# Patient Record
Sex: Male | Born: 1945 | Race: White | Hispanic: No | Smoking: Current every day smoker
Health system: Southern US, Community
[De-identification: ages and names within clinical notes are randomized; demographics above are authoritative.]

## PROBLEM LIST (undated history)

## (undated) DIAGNOSIS — I639 Cerebral infarction, unspecified: Secondary | ICD-10-CM

## (undated) DIAGNOSIS — F0150 Vascular dementia without behavioral disturbance: Secondary | ICD-10-CM

## (undated) DIAGNOSIS — I10 Essential (primary) hypertension: Secondary | ICD-10-CM

## (undated) DIAGNOSIS — M069 Rheumatoid arthritis, unspecified: Secondary | ICD-10-CM

## (undated) DIAGNOSIS — F319 Bipolar disorder, unspecified: Secondary | ICD-10-CM

## (undated) HISTORY — PX: NO PAST SURGERIES: SHX2092

---

## 2016-07-06 ENCOUNTER — Inpatient Hospital Stay (HOSPITAL_COMMUNITY)
Admission: EM | Admit: 2016-07-06 | Discharge: 2016-07-12 | DRG: 371 | Disposition: A | Discharge status: Skilled Nursing Facility | Payer: Medicare HMO | Attending: Internal Medicine | Admitting: Internal Medicine

## 2016-07-06 ENCOUNTER — Emergency Department (HOSPITAL_COMMUNITY): Payer: Medicare HMO

## 2016-07-06 ENCOUNTER — Encounter (HOSPITAL_COMMUNITY): Payer: Self-pay | Admitting: Internal Medicine

## 2016-07-06 DIAGNOSIS — W19XXXA Unspecified fall, initial encounter: Secondary | ICD-10-CM

## 2016-07-06 DIAGNOSIS — B359 Dermatophytosis, unspecified: Secondary | ICD-10-CM

## 2016-07-06 DIAGNOSIS — S0191XA Laceration without foreign body of unspecified part of head, initial encounter: Secondary | ICD-10-CM

## 2016-07-06 DIAGNOSIS — S01111A Laceration without foreign body of right eyelid and periocular area, initial encounter: Secondary | ICD-10-CM | POA: Diagnosis present

## 2016-07-06 DIAGNOSIS — A047 Enterocolitis due to Clostridium difficile: Secondary | ICD-10-CM | POA: Diagnosis present

## 2016-07-06 DIAGNOSIS — Z8673 Personal history of transient ischemic attack (TIA), and cerebral infarction without residual deficits: Secondary | ICD-10-CM

## 2016-07-06 DIAGNOSIS — E876 Hypokalemia: Secondary | ICD-10-CM | POA: Diagnosis present

## 2016-07-06 DIAGNOSIS — Z87891 Personal history of nicotine dependence: Secondary | ICD-10-CM

## 2016-07-06 DIAGNOSIS — W010XXA Fall on same level from slipping, tripping and stumbling without subsequent striking against object, initial encounter: Secondary | ICD-10-CM | POA: Diagnosis present

## 2016-07-06 DIAGNOSIS — N179 Acute kidney failure, unspecified: Secondary | ICD-10-CM

## 2016-07-06 DIAGNOSIS — S51811A Laceration without foreign body of right forearm, initial encounter: Secondary | ICD-10-CM | POA: Diagnosis present

## 2016-07-06 DIAGNOSIS — Z7952 Long term (current) use of systemic steroids: Secondary | ICD-10-CM | POA: Diagnosis not present

## 2016-07-06 DIAGNOSIS — Z681 Body mass index (BMI) 19 or less, adult: Secondary | ICD-10-CM | POA: Diagnosis not present

## 2016-07-06 DIAGNOSIS — Z23 Encounter for immunization: Secondary | ICD-10-CM | POA: Diagnosis not present

## 2016-07-06 DIAGNOSIS — E43 Unspecified severe protein-calorie malnutrition: Secondary | ICD-10-CM | POA: Diagnosis present

## 2016-07-06 DIAGNOSIS — S0181XA Laceration without foreign body of other part of head, initial encounter: Secondary | ICD-10-CM | POA: Diagnosis not present

## 2016-07-06 DIAGNOSIS — E860 Dehydration: Secondary | ICD-10-CM | POA: Diagnosis present

## 2016-07-06 DIAGNOSIS — D696 Thrombocytopenia, unspecified: Secondary | ICD-10-CM | POA: Diagnosis present

## 2016-07-06 DIAGNOSIS — I10 Essential (primary) hypertension: Secondary | ICD-10-CM | POA: Diagnosis present

## 2016-07-06 DIAGNOSIS — F0150 Vascular dementia without behavioral disturbance: Secondary | ICD-10-CM | POA: Diagnosis present

## 2016-07-06 DIAGNOSIS — M069 Rheumatoid arthritis, unspecified: Secondary | ICD-10-CM | POA: Diagnosis present

## 2016-07-06 DIAGNOSIS — F319 Bipolar disorder, unspecified: Secondary | ICD-10-CM | POA: Diagnosis present

## 2016-07-06 DIAGNOSIS — S81801A Unspecified open wound, right lower leg, initial encounter: Secondary | ICD-10-CM | POA: Diagnosis present

## 2016-07-06 DIAGNOSIS — D649 Anemia, unspecified: Secondary | ICD-10-CM

## 2016-07-06 DIAGNOSIS — Z79899 Other long term (current) drug therapy: Secondary | ICD-10-CM | POA: Diagnosis not present

## 2016-07-06 DIAGNOSIS — F0390 Unspecified dementia without behavioral disturbance: Secondary | ICD-10-CM

## 2016-07-06 DIAGNOSIS — R197 Diarrhea, unspecified: Secondary | ICD-10-CM | POA: Diagnosis present

## 2016-07-06 DIAGNOSIS — L89151 Pressure ulcer of sacral region, stage 1: Secondary | ICD-10-CM | POA: Diagnosis present

## 2016-07-06 HISTORY — DX: Cerebral infarction, unspecified: I63.9

## 2016-07-06 HISTORY — DX: Vascular dementia, unspecified severity, without behavioral disturbance, psychotic disturbance, mood disturbance, and anxiety: F01.50

## 2016-07-06 HISTORY — DX: Rheumatoid arthritis, unspecified: M06.9

## 2016-07-06 HISTORY — DX: Bipolar disorder, unspecified: F31.9

## 2016-07-06 HISTORY — DX: Essential (primary) hypertension: I10

## 2016-07-06 LAB — CBC WITH DIFFERENTIAL/PLATELET
BASOS ABS: 0 10*3/uL (ref 0.0–0.1)
Basophils Relative: 0 %
Eosinophils Absolute: 0 10*3/uL (ref 0.0–0.7)
Eosinophils Relative: 0 %
HEMATOCRIT: 35.7 % — AB (ref 39.0–52.0)
HEMOGLOBIN: 12.2 g/dL — AB (ref 13.0–17.0)
LYMPHS PCT: 6 %
Lymphs Abs: 1.1 10*3/uL (ref 0.7–4.0)
MCH: 30.1 pg (ref 26.0–34.0)
MCHC: 34.2 g/dL (ref 30.0–36.0)
MCV: 88.1 fL (ref 78.0–100.0)
MONOS PCT: 3 %
Monocytes Absolute: 0.5 10*3/uL (ref 0.1–1.0)
NEUTROS PCT: 91 %
Neutro Abs: 15.9 10*3/uL — ABNORMAL HIGH (ref 1.7–7.7)
Platelets: 197 10*3/uL (ref 150–400)
RBC: 4.05 MIL/uL — AB (ref 4.22–5.81)
RDW: 19.5 % — AB (ref 11.5–15.5)
WBC: 17.5 10*3/uL — AB (ref 4.0–10.5)

## 2016-07-06 LAB — BASIC METABOLIC PANEL
ANION GAP: 11 (ref 5–15)
BUN: 15 mg/dL (ref 6–20)
CALCIUM: 8.3 mg/dL — AB (ref 8.9–10.3)
CHLORIDE: 100 mmol/L — AB (ref 101–111)
CO2: 24 mmol/L (ref 22–32)
Creatinine, Ser: 1.61 mg/dL — ABNORMAL HIGH (ref 0.61–1.24)
GFR calc non Af Amer: 41 mL/min — ABNORMAL LOW (ref >60)
GFR, EST AFRICAN AMERICAN: 48 mL/min — AB (ref >60)
Glucose, Bld: 103 mg/dL — ABNORMAL HIGH (ref 65–99)
Potassium: 3.1 mmol/L — ABNORMAL LOW (ref 3.5–5.1)
Sodium: 135 mmol/L (ref 135–145)

## 2016-07-06 LAB — URINE MICROSCOPIC-ADD ON

## 2016-07-06 LAB — URINALYSIS, ROUTINE W REFLEX MICROSCOPIC
Glucose, UA: NEGATIVE mg/dL
KETONES UR: 15 mg/dL — AB
NITRITE: NEGATIVE
PROTEIN: 100 mg/dL — AB
SPECIFIC GRAVITY, URINE: 1.021 (ref 1.005–1.030)
pH: 6 (ref 5.0–8.0)

## 2016-07-06 LAB — C DIFFICILE QUICK SCREEN W PCR REFLEX
C Diff antigen: POSITIVE — AB
C Diff interpretation: DETECTED
C Diff toxin: POSITIVE — AB

## 2016-07-06 LAB — CK: CK TOTAL: 225 U/L (ref 49–397)

## 2016-07-06 LAB — I-STAT CG4 LACTIC ACID, ED: Lactic Acid, Venous: 1.34 mmol/L (ref 0.5–1.9)

## 2016-07-06 MED ORDER — NICOTINE 14 MG/24HR TD PT24
14.0000 mg | MEDICATED_PATCH | Freq: Every day | TRANSDERMAL | Status: DC
  Administered 2016-07-07 – 2016-07-12 (×6): 14 mg via TRANSDERMAL
  Filled 2016-07-06 (×6): qty 1

## 2016-07-06 MED ORDER — PERPHENAZINE 4 MG PO TABS
4.0000 mg | ORAL_TABLET | Freq: Every day | ORAL | Status: DC
  Administered 2016-07-07 – 2016-07-08 (×2): 4 mg via ORAL
  Filled 2016-07-06 (×2): qty 1

## 2016-07-06 MED ORDER — LIDOCAINE-EPINEPHRINE 2 %-1:100000 IJ SOLN: 20.0000 mL | Freq: Once | INTRAMUSCULAR | Status: DC

## 2016-07-06 MED ORDER — ACETAMINOPHEN 650 MG RE SUPP: 650.0000 mg | Freq: Four times a day (QID) | RECTAL | Status: DC | PRN

## 2016-07-06 MED ORDER — SODIUM CHLORIDE 0.9 % IV BOLUS (SEPSIS)
500.0000 mL | Freq: Once | INTRAVENOUS | Status: AC
  Administered 2016-07-06: 500 mL via INTRAVENOUS

## 2016-07-06 MED ORDER — NICOTINE 14 MG/24HR TD PT24
14.0000 mg | MEDICATED_PATCH | Freq: Once | TRANSDERMAL | Status: AC
  Administered 2016-07-06: 14 mg via TRANSDERMAL
  Filled 2016-07-06 (×2): qty 1

## 2016-07-06 MED ORDER — FINASTERIDE 5 MG PO TABS
5.0000 mg | ORAL_TABLET | Freq: Every day | ORAL | Status: DC
Start: 2016-07-07 — End: 2016-07-12
  Administered 2016-07-07 – 2016-07-12 (×6): 5 mg via ORAL
  Filled 2016-07-06 (×6): qty 1

## 2016-07-06 MED ORDER — NYSTATIN 100000 UNIT/GM EX POWD
Freq: Two times a day (BID) | CUTANEOUS | Status: DC
  Administered 2016-07-07: 09:00:00 via TOPICAL
  Administered 2016-07-07: 1 via TOPICAL
  Administered 2016-07-07 – 2016-07-12 (×9): via TOPICAL
  Filled 2016-07-06: qty 15

## 2016-07-06 MED ORDER — ONDANSETRON HCL 4 MG/2ML IJ SOLN: 4.0000 mg | Freq: Four times a day (QID) | INTRAMUSCULAR | Status: DC | PRN

## 2016-07-06 MED ORDER — PREDNISONE 5 MG PO TABS
5.0000 mg | ORAL_TABLET | Freq: Every day | ORAL | Status: DC
  Administered 2016-07-07 – 2016-07-12 (×6): 5 mg via ORAL
  Filled 2016-07-06 (×6): qty 1

## 2016-07-06 MED ORDER — HALOPERIDOL LACTATE 5 MG/ML IJ SOLN
2.0000 mg | Freq: Once | INTRAMUSCULAR | Status: AC
  Administered 2016-07-06: 2 mg via INTRAVENOUS
  Filled 2016-07-06: qty 1

## 2016-07-06 MED ORDER — DONEPEZIL HCL 10 MG PO TABS
10.0000 mg | ORAL_TABLET | Freq: Every day | ORAL | Status: DC
  Administered 2016-07-06 – 2016-07-11 (×6): 10 mg via ORAL
  Filled 2016-07-06 (×6): qty 1

## 2016-07-06 MED ORDER — FLUTICASONE FUROATE-VILANTEROL 100-25 MCG/INH IN AEPB
1.0000 | INHALATION_SPRAY | Freq: Every day | RESPIRATORY_TRACT | Status: DC
  Administered 2016-07-07 – 2016-07-12 (×5): 1 via RESPIRATORY_TRACT
  Filled 2016-07-06: qty 28

## 2016-07-06 MED ORDER — METOPROLOL TARTRATE 50 MG PO TABS
50.0000 mg | ORAL_TABLET | Freq: Two times a day (BID) | ORAL | Status: DC
  Administered 2016-07-06 – 2016-07-11 (×11): 50 mg via ORAL
  Filled 2016-07-06 (×12): qty 1

## 2016-07-06 MED ORDER — PERPHENAZINE 8 MG PO TABS
8.0000 mg | ORAL_TABLET | Freq: Every day | ORAL | Status: DC
  Administered 2016-07-07 (×2): 8 mg via ORAL
  Filled 2016-07-06 (×3): qty 1

## 2016-07-06 MED ORDER — SODIUM CHLORIDE 0.9 % IV SOLN
INTRAVENOUS | Status: DC
  Administered 2016-07-06 – 2016-07-07 (×2): 1 mL via INTRAVENOUS

## 2016-07-06 MED ORDER — QUETIAPINE FUMARATE 50 MG PO TABS
50.0000 mg | ORAL_TABLET | Freq: Every day | ORAL | Status: DC
  Administered 2016-07-06 – 2016-07-11 (×6): 50 mg via ORAL
  Filled 2016-07-06 (×6): qty 1

## 2016-07-06 MED ORDER — POTASSIUM CHLORIDE CRYS ER 20 MEQ PO TBCR
40.0000 meq | EXTENDED_RELEASE_TABLET | Freq: Once | ORAL | Status: AC
  Administered 2016-07-06: 40 meq via ORAL
  Filled 2016-07-06: qty 2

## 2016-07-06 MED ORDER — TETANUS-DIPHTH-ACELL PERTUSSIS 5-2.5-18.5 LF-MCG/0.5 IM SUSP
0.5000 mL | Freq: Once | INTRAMUSCULAR | Status: AC
  Administered 2016-07-06: 0.5 mL via INTRAMUSCULAR
  Filled 2016-07-06: qty 0.5

## 2016-07-06 MED ORDER — POTASSIUM CHLORIDE CRYS ER 10 MEQ PO TBCR
10.0000 meq | EXTENDED_RELEASE_TABLET | Freq: Every day | ORAL | Status: DC
  Administered 2016-07-07 – 2016-07-08 (×2): 10 meq via ORAL
  Filled 2016-07-06 (×2): qty 1

## 2016-07-06 MED ORDER — ONDANSETRON HCL 4 MG PO TABS: 4.0000 mg | ORAL_TABLET | Freq: Four times a day (QID) | ORAL | Status: DC | PRN

## 2016-07-06 MED ORDER — FOLIC ACID 1 MG PO TABS
1.0000 mg | ORAL_TABLET | Freq: Every day | ORAL | Status: DC
  Administered 2016-07-07 – 2016-07-12 (×6): 1 mg via ORAL
  Filled 2016-07-06 (×6): qty 1

## 2016-07-06 MED ORDER — LIDOCAINE HCL (PF) 1 % IJ SOLN
5.0000 mL | Freq: Once | INTRAMUSCULAR | Status: AC
  Administered 2016-07-06: 5 mL via INTRADERMAL
  Filled 2016-07-06: qty 5

## 2016-07-06 MED ORDER — ACETAMINOPHEN 325 MG PO TABS
650.0000 mg | ORAL_TABLET | Freq: Four times a day (QID) | ORAL | Status: DC | PRN
  Administered 2016-07-07: 650 mg via ORAL
  Filled 2016-07-06: qty 2

## 2016-07-06 NOTE — ED Notes (Signed)
Pt's dinner tray arrived. 

## 2016-07-06 NOTE — ED Triage Notes (Addendum)
EMS brought patient from apartment, where he was found by nurses aid. Lac to R eyebrow and R forearm; c/o pain on  "bottom". EMS gave 500 cc bolus on route. Reports no total loss of consciousness; pt has confusion at baseline.

## 2016-07-06 NOTE — ED Notes (Signed)
Delay in lab draw, pt in exray 

## 2016-07-06 NOTE — ED Notes (Signed)
NP at bedside.

## 2016-07-06 NOTE — ED Notes (Signed)
Suture Cart at Bedside 

## 2016-07-06 NOTE — ED Provider Notes (Signed)
[facility]-EMERGENCY DEPT Provider Note   CSN: [id number] Arrival date & time: 07/06/16  1132     History   Chief Complaint Chief Complaint  Patient presents with  . Fall    HPI Paul Hansen is a 70 y.o. male.  The history is provided by the patient and medical records.  Fall    70 year old male with unknown past medical history presenting to the ED after a fall. Patient is a poor historian. He was reportedly found by a nurse's aide this morning on the floor in his apartment. He states he fell sometime last night but thought he got back in the bed. Questionable LOC.  States he lives alone but someone comes to bring him food during the day and check on him. He has a laceration to the right eyebrow as well as skin tears of the right forearm and right knee.  Patient complains of buttock pain as well. On arrival, patient is covered and loose stool. Skin in his genital region and buttock area appears very irritated.  States he has some "sores" on his butt that have been there for quite some time.  No past medical history on file.  There are no active problems to display for this patient.   No past surgical history on file.     Home Medications    Prior to Admission medications   Not on File    Family History No family history on file.  Social History Social History  Substance Use Topics  . Smoking status: Not on file  . Smokeless tobacco: Not on file  . Alcohol use Not on file     Allergies   Review of patient's allergies indicates not on file.   Review of Systems Review of Systems  Unable to perform ROS: Dementia     Physical Exam Updated Vital Signs BP 108/63   Pulse 76   Resp 22   SpO2 99%   Physical Exam  Constitutional: He is oriented to person, place, and time. He appears well-developed and well-nourished.  HENT:  Head: Normocephalic and atraumatic.    Mouth/Throat: Oropharynx is clear and moist.  2cm irregular laceration above right eyebrow, no  active bleeding, dried blood surrounding wound; no skull depression noted  Eyes: Conjunctivae and EOM are normal. Pupils are equal, round, and reactive to light.  Neck: Normal range of motion.  Cardiovascular: Normal rate, regular rhythm and normal heart sounds.   Pulmonary/Chest: Effort normal and breath sounds normal.  Abdominal: Soft. Bowel sounds are normal.  Genitourinary:  Genitourinary Comments: Skin in the groin creases and buttock area appears red and irritated, there are some skin sores noted along his buttock, no bleeding, no purulent drainage, no abscess formation  Musculoskeletal: Normal range of motion.  Skin tears of right proximal dorsal forearm as well as right knee Right knee is mildly swollen without gross deformity. Full flexion and extension maintained, DP pulses intact bilaterally Pelvis stable and non-tender; no leg shortening  Neurological: He is alert and oriented to person, place, and time.  AAO to self and place; spontaneously moving extremities, following commands when directed, answering most questions appropriately; no apparent ataxia, speech overall clear and goal oriented  Skin: Skin is warm and dry.  Psychiatric: He has a normal mood and affect.  Nursing note and vitals reviewed.    ED Treatments / Results  Labs (all labs ordered are listed, but only abnormal results are displayed) Labs Reviewed  CBC WITH DIFFERENTIAL/PLATELET - Abnormal; Notable for  the following:       Result Value   WBC 17.5 (*)    RBC 4.05 (*)    Hemoglobin 12.2 (*)    HCT 35.7 (*)    RDW 19.5 (*)    Neutro Abs 15.9 (*)    All other components within normal limits  BASIC METABOLIC PANEL - Abnormal; Notable for the following:    Potassium 3.1 (*)    Chloride 100 (*)    Glucose, Bld 103 (*)    Creatinine, Ser 1.61 (*)    Calcium 8.3 (*)    GFR calc non Af Amer 41 (*)    GFR calc Af Amer 48 (*)    All other components within normal limits  URINE CULTURE  CK  URINALYSIS,  ROUTINE W REFLEX MICROSCOPIC (NOT AT [facility])  I-STAT CG4 LACTIC ACID, ED    EKG  EKG Interpretation None       Radiology Dg Forearm Right  Result Date: 07/06/2016 CLINICAL DATA:  Larey Seat today.  Right forearm pain. EXAM: RIGHT FOREARM - 2 VIEW COMPARISON:  None. FINDINGS: The wrist and elbow joints are maintained. No acute forearm fracture. IMPRESSION: No acute fracture. Electronically Signed   By: Rudie Meyer M.D.   On: 07/06/2016 13:19   Ct Head Wo Contrast  Result Date: 07/06/2016 CLINICAL DATA:  Fall last night. Laceration above the right eye. Initial encounter. EXAM: CT HEAD WITHOUT CONTRAST CT CERVICAL SPINE WITHOUT CONTRAST TECHNIQUE: Multidetector CT imaging of the head and cervical spine was performed following the standard protocol without intravenous contrast. Multiplanar CT image reconstructions of the cervical spine were also generated. COMPARISON:  Brain MRI 12/03/2009. FINDINGS: CT HEAD FINDINGS Brain: There is moderate cerebral atrophy with greatest involvement of the parietal lobes. 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 There is no evidence of acute cortical infarct, intracranial hemorrhage, mass, midline shift, or extra-axial fluid collection. Patchy to confluent cerebral white matter hypodensities are nonspecific but compatible with moderate chronic small vessel ischemic disease, advanced for age. Vascular: Calcified atherosclerosis at the skullbase. Skull: No fracture identified. Sinuses/Orbits: Visualized paranasal sinuses and mastoid air cells are clear. Orbits are unremarkable. Other: Mild supraorbital soft tissue swelling. CT CERVICAL SPINE FINDINGS Alignment: No evidence of traumatic subluxation. Skull base and vertebrae: No fracture or destructive osseous lesion. Soft tissues and spinal canal: No prevertebral edema. Mild right-sided cervical atherosclerosis. Disc levels:  Mild cervical spondylosis, most notable at C6-7. Upper chest: Advanced  centrilobular emphysema. Right greater than left apical lung scarring. Other: None. IMPRESSION: 1. No evidence of acute intracranial abnormality. 2. Mild right supraorbital soft tissue swelling. 3. Moderately advanced chronic small vessel ischemic disease. 4. No acute abnormality identified in the cervical spine. Electronically Signed   By: Sebastian Ache M.D.   On: 07/06/2016 13:51   Ct Cervical Spine Wo Contrast  Result Date: 07/06/2016 CLINICAL DATA:  Fall last night. Laceration above the right eye. Initial encounter. EXAM: CT HEAD WITHOUT CONTRAST CT CERVICAL SPINE WITHOUT CONTRAST TECHNIQUE: Multidetector CT imaging of the head and cervical spine was performed following the standard protocol without intravenous contrast. Multiplanar CT image reconstructions of the cervical spine were also generated. COMPARISON:  Brain MRI 12/03/2009. FINDINGS: CT HEAD FINDINGS Brain: There is moderate cerebral atrophy with greatest involvement of the parietal lobes. 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 There is no evidence of acute cortical infarct, intracranial hemorrhage, mass, midline shift, or extra-axial fluid collection. Patchy to confluent cerebral  white matter hypodensities are nonspecific but compatible with moderate chronic small vessel ischemic disease, advanced for age. Vascular: Calcified atherosclerosis at the skullbase. Skull: No fracture identified. Sinuses/Orbits: Visualized paranasal sinuses and mastoid air cells are clear. Orbits are unremarkable. Other: Mild supraorbital soft tissue swelling. CT CERVICAL SPINE FINDINGS Alignment: No evidence of traumatic subluxation. Skull base and vertebrae: No fracture or destructive osseous lesion. Soft tissues and spinal canal: No prevertebral edema. Mild right-sided cervical atherosclerosis. Disc levels:  Mild cervical spondylosis, most notable at C6-7. Upper chest: Advanced centrilobular emphysema. Right greater than left apical lung  scarring. Other: None. IMPRESSION: 1. No evidence of acute intracranial abnormality. 2. Mild right supraorbital soft tissue swelling. 3. Moderately advanced chronic small vessel ischemic disease. 4. No acute abnormality identified in the cervical spine. Electronically Signed   By: Sebastian Ache M.D.   On: 07/06/2016 13:51   Dg Knee Complete 4 Views Right  Result Date: 07/06/2016 CLINICAL DATA:  Tripped over own feet and fell, weakness, anterior RIGHT laceration EXAM: RIGHT KNEE - COMPLETE 4+ VIEW COMPARISON:  Non FINDINGS: Osseous demineralization. Joint space narrowing. No acute fracture, dislocation, or bone destruction. No knee joint effusion. Scattered vascular calcifications. IMPRESSION: Osseous demineralization with mild degenerative changes. No acute bony abnormalities identified. Electronically Signed   By: Ulyses Southward M.D.   On: 07/06/2016 13:21    Procedures Procedures (including critical care time)  LACERATION REPAIR Performed by: Garlon Hatchet Authorized by: Garlon Hatchet Consent: Verbal consent obtained. Risks and benefits: risks, benefits and alternatives were discussed Consent given by: patient Patient identity confirmed: provided demographic data Prepped and Draped in normal sterile fashion Wound explored  Laceration Location: right eyebrow  Laceration Length: 2cm  No Foreign Bodies seen or palpated  Anesthesia: local infiltration  Local anesthetic: lidocaine 1% without epinephrine  Anesthetic total: 3 ml  Irrigation method: syringe Amount of cleaning: standard  Skin closure: 4-0 prolene  Number of sutures: 2  Technique: simple interrupted  Patient tolerance: Patient tolerated the procedure well with no immediate complications.   Medications Ordered in ED Medications  nicotine (NICODERM CQ - dosed in mg/24 hours) patch 14 mg (14 mg Transdermal Patch Applied 07/06/16 1503)  potassium chloride SA (K-DUR,KLOR-CON) CR tablet 40 mEq (not administered)    sodium chloride 0.9 % bolus 500 mL (not administered)  sodium chloride 0.9 % bolus 500 mL (0 mLs Intravenous Stopped 07/06/16 1510)  Tdap (BOOSTRIX) injection 0.5 mL (0.5 mLs Intramuscular Given 07/06/16 1414)  lidocaine (PF) (XYLOCAINE) 1 % injection 5 mL (5 mLs Intradermal Given 07/06/16 1411)     Initial Impression / Assessment and Plan / ED Course  I have reviewed the triage vital signs and the nursing notes.  Pertinent labs & imaging results that were available during my care of the patient were reviewed by me and considered in my medical decision making (see chart for details).  Clinical Course   70 year old male here after an apparent fall. Patient is a very poor historian and has baseline confusion. He is awake and alert, oriented to self and place. States he thinks he fell sometime last night but is not entirely sure. Unsure of loss of consciousness.  Patient is unaware of his baseline medical issues.  We'll obtain screening labs as well as CT head and cervical spine. He has some skin tears noted his right forearm and right knee, will also obtain plain films of these areas.  Tetanus updated.  Skin of buttock and genital region appear irritated without  abscess formation or drainage. Brief changed, cream applied.    Patient screening labs with leukocytosis.  No left shift.  Lactate normal.  No systemic signs of infection currently.  Mild hypokalemia at 3.1-- given replacement here. His serum creatinine is 1.61, no old values available for comparison.  Patient was given IVF here.  Imaging is negative for acute injuries.  Tetanus updated.  Laceration repaired as above, patient tolerated well. Patient remains at his baseline.  Eating Mal[place]key sandwich and drinking juice.  UA pending.  2:39 PM Spoke with patient's social worker Ms. Monroe-- patient has confusion at baseline and has been his at his norm recently.  Facility staff member, Lola, states patient came downstairs this morning and for  breakfast and told her that he had fallen in his room.  He had blood on his head.  Apparently patient's nurses aid who was with him to assist with ADL's quit last week so this is his first week he has been alone in his facility without direct supervision/assistance.  Patient's social worker is currently working with Eligha BridegroomShannon Gray on trying to get him into a SNF.  States there is availability, they are trying to complete paperwork.  Appreciates medical evaluation today.  States ok to transport back to his facility, they will arrange temporary care for him until they can get him placed.  4:05 PM Care signed out to oncoming provider, PA [place].  Will follow results of UA, anticipate discharge.  He will need PTAR to transport back to his facility.  Case discussed with attending physician, Dr. Estell HarpinZammit, who agrees with assessment and plan of care.  Final Clinical Impressions(s) / ED Diagnoses   Final diagnoses:  Fall, initial encounter  Laceration of head, initial encounter  Hypokalemia    New Prescriptions New Prescriptions   No medications on file     Garlon HatchetLisa M Sheriece Jefcoat, PA-C 07/06/16 [address][id number]    Zanaria Morell M [place] Spainhower, PA-C 07/06/16 1607    Bethann BerkshireJoseph Zammit, MD 07/08/16 [phone]

## 2016-07-06 NOTE — ED Notes (Signed)
Gave pt coke per Chrislyn-RN.

## 2016-07-06 NOTE — H&P (Signed)
History and Physical    Paul Hansen URK:[id number] DOB: 12-21-1945 DOA: 07/06/2016  PCP: Dan Maker, MD   Patient coming from: Home ([facility] in [place], [phone])  Chief Complaint: Referred to ED after having a fall in his apartment  HPI: Paul Hansen is a 70 y.o. gentleman with a history of RA on methotrexate and prednisone, vascular dementia, bipolar disorder, prior CVA, and HTN who was referred to the ED after having a fall in his apartment (unwitnessed) that resulted in a laceration over his right eye.  He denies loss of consciousness.  He denies preceding aura, headache, palpitations, chest pain, or shortness of breath.  He denies any blood in his urine or stool.  He has had three episodes of diarrhea in the ED today, but he denies a history of diarrhea prior today.  No abdominal pain.  No nausea or vomiting.  The patient is a limited historian due to his history of dementia.  He has a legal guardian Annell Greening who was able to provide some additional clinical history (records faxed), which has been reviewed.  ED Course: Imaging including right forearm xray, right knee xray, and CT of the head and cervical spine do not show acute fractures.  His right eyebrow laceration has been sutured.  He has a leukocytosis of 17.  He has had three episodes of diarrhea in the ED.  Stool C diff PCR and GI panel ordered.  Enteric precautions.  No empiric antibiotics for now.  Lactic acid level 1.34; he does not appear septic.  Hospitalist asked to admit.  He will need SNF placement.  Review of Systems: As per HPI otherwise 10 point review of systems negative.    Past Medical History:  Diagnosis Date  . Bipolar disorder (HCC)   . Hypertension   . Rheumatoid arthritis (HCC)   . Stroke (HCC)   . Vascular dementia     Past Surgical History:  Procedure Laterality Date  . NO PAST SURGERIES    Except "minor orthopedic procedures" per the patient.   reports that he has  been smoking.  He has never used smokeless tobacco. He reports that he does not drink alcohol or use drugs.  Active tobacco use "all his life"; currently smokes 1ppd.  He is divorced.  He has an adult son and daughter who visit but he has a legal guardian who makes medical decisions.  No Known Allergies  Family History  Problem Relation Age of Onset  . Family history unknown: Yes  He denies any known history of conditions that run in his family.   Prior to Admission medications   Not on File  List was received by fax and reviewed by me.  Pharmacy to enter home medications into the system for reconciliation at discharge.  Physical Exam: Vitals:   07/06/16 1715 07/06/16 1730 07/06/16 1815 07/06/16 1845  BP: 135/95 105/74 107/94 120/95  Pulse: 98 91 100 110  Resp:      SpO2: 99% 100% 98% 97%      Constitutional: NAD, calm, attempting to resist admission to the hospital but cooperative once redirected.  Unkempt appearance.  Thin elderly gentleman, frail appearing. Vitals:   07/06/16 1715 07/06/16 1730 07/06/16 1815 07/06/16 1845  BP: 135/95 105/74 107/94 120/95  Pulse: 98 91 100 110  Resp:      SpO2: 99% 100% 98% 97%   Eyes: PERRL, lids and conjunctivae normal.  Laceration above right eye. ENMT: Mucous membranes are moist.  Posterior pharynx clear of any exudate or lesions. Normal dentition.  Neck: normal appearance, supple, no masses Respiratory: clear to auscultation bilaterally, no wheezing, no crackles. Normal respiratory effort. No accessory muscle use.  Cardiovascular: Normal rate, regular rhythm, no murmurs / rubs / gallops. No extremity edema. 2+ pedal pulses. GI: abdomen is soft and compressible.  No distention.  No tenderness.  No masses palpated.  Bowel sounds are present. Musculoskeletal:  No joint deformity in upper and lower extremities. Good ROM, no contractures. Normal muscle tone.  Skin: multiple bruises present, probable tinea in inguinal folds, at least stage I  breakdown in gluteal fold, POA Neurologic: CN 2-12 grossly intact. Sensation intact, Strength symmetric bilaterally, 5/5  Psychiatric: Becomes agitated when talking about hospital admission.  He is only oriented to person.  His judgement and insight are impaired.    Labs on Admission: I have personally reviewed following labs and imaging studies  CBC:  Recent Labs Lab 07/06/16 1356  WBC 17.5*  NEUTROABS 15.9*  HGB 12.2*  HCT 35.7*  MCV 88.1  PLT 197   Basic Metabolic Panel:  Recent Labs Lab 07/06/16 1356  NA 135  K 3.1*  CL 100*  CO2 24  GLUCOSE 103*  BUN 15  CREATININE 1.61*  CALCIUM 8.3*   Cardiac Enzymes:  Recent Labs Lab 07/06/16 1356  CKTOTAL 225   Urine analysis:    Component Value Date/Time   COLORURINE AMBER (A) 07/06/2016 1627   APPEARANCEUR CLEAR 07/06/2016 1627   LABSPEC 1.021 07/06/2016 1627   PHURINE 6.0 07/06/2016 1627   GLUCOSEU NEGATIVE 07/06/2016 1627   HGBUR TRACE (A) 07/06/2016 1627   BILIRUBINUR MODERATE (A) 07/06/2016 1627   KETONESUR 15 (A) 07/06/2016 1627   PROTEINUR 100 (A) 07/06/2016 1627   NITRITE NEGATIVE 07/06/2016 1627   LEUKOCYTESUR TRACE (A) 07/06/2016 1627   Sepsis Labs:  Lactic acid level 1.34  Radiological Exams on Admission: Dg Forearm Right  Result Date: 07/06/2016 CLINICAL DATA:  Larey Seat today.  Right forearm pain. EXAM: RIGHT FOREARM - 2 VIEW COMPARISON:  None. FINDINGS: The wrist and elbow joints are maintained. No acute forearm fracture. IMPRESSION: No acute fracture. Electronically Signed   By: Rudie Meyer M.D.   On: 07/06/2016 13:19   Ct Head Wo Contrast  Result Date: 07/06/2016 CLINICAL DATA:  Fall last night. Laceration above the right eye. Initial encounter. EXAM: CT HEAD WITHOUT CONTRAST CT CERVICAL SPINE WITHOUT CONTRAST TECHNIQUE: Multidetector CT imaging of the head and cervical spine was performed following the standard protocol without intravenous contrast. Multiplanar CT image reconstructions of the  cervical spine were also generated. COMPARISON:  Brain MRI 12/03/2009. FINDINGS: CT HEAD FINDINGS Brain: There is moderate cerebral atrophy with greatest involvement of the parietal lobes. 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 There is no evidence of acute cortical infarct, intracranial hemorrhage, mass, midline shift, or extra-axial fluid collection. Patchy to confluent cerebral white matter hypodensities are nonspecific but compatible with moderate chronic small vessel ischemic disease, advanced for age. Vascular: Calcified atherosclerosis at the skullbase. Skull: No fracture identified. Sinuses/Orbits: Visualized paranasal sinuses and mastoid air cells are clear. Orbits are unremarkable. Other: Mild supraorbital soft tissue swelling. CT CERVICAL SPINE FINDINGS Alignment: No evidence of traumatic subluxation. Skull base and vertebrae: No fracture or destructive osseous lesion. Soft tissues and spinal canal: No prevertebral edema. Mild right-sided cervical atherosclerosis. Disc levels:  Mild cervical spondylosis, most notable at C6-7. Upper chest: Advanced centrilobular emphysema. Right greater than left apical lung  scarring. Other: None. IMPRESSION: 1. No evidence of acute intracranial abnormality. 2. Mild right supraorbital soft tissue swelling. 3. Moderately advanced chronic small vessel ischemic disease. 4. No acute abnormality identified in the cervical spine. Electronically Signed   By: Sebastian Ache M.D.   On: 07/06/2016 13:51   Ct Cervical Spine Wo Contrast  Result Date: 07/06/2016 CLINICAL DATA:  Fall last night. Laceration above the right eye. Initial encounter. EXAM: CT HEAD WITHOUT CONTRAST CT CERVICAL SPINE WITHOUT CONTRAST TECHNIQUE: Multidetector CT imaging of the head and cervical spine was performed following the standard protocol without intravenous contrast. Multiplanar CT image reconstructions of the cervical spine were also generated. COMPARISON:  Brain MRI  12/03/2009. FINDINGS: CT HEAD FINDINGS Brain: There is moderate cerebral atrophy with greatest involvement of the parietal lobes. 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 There is no evidence of acute cortical infarct, intracranial hemorrhage, mass, midline shift, or extra-axial fluid collection. Patchy to confluent cerebral white matter hypodensities are nonspecific but compatible with moderate chronic small vessel ischemic disease, advanced for age. Vascular: Calcified atherosclerosis at the skullbase. Skull: No fracture identified. Sinuses/Orbits: Visualized paranasal sinuses and mastoid air cells are clear. Orbits are unremarkable. Other: Mild supraorbital soft tissue swelling. CT CERVICAL SPINE FINDINGS Alignment: No evidence of traumatic subluxation. Skull base and vertebrae: No fracture or destructive osseous lesion. Soft tissues and spinal canal: No prevertebral edema. Mild right-sided cervical atherosclerosis. Disc levels:  Mild cervical spondylosis, most notable at C6-7. Upper chest: Advanced centrilobular emphysema. Right greater than left apical lung scarring. Other: None. IMPRESSION: 1. No evidence of acute intracranial abnormality. 2. Mild right supraorbital soft tissue swelling. 3. Moderately advanced chronic small vessel ischemic disease. 4. No acute abnormality identified in the cervical spine. Electronically Signed   By: Sebastian Ache M.D.   On: 07/06/2016 13:51   Dg Knee Complete 4 Views Right  Result Date: 07/06/2016 CLINICAL DATA:  Tripped over own feet and fell, weakness, anterior RIGHT laceration EXAM: RIGHT KNEE - COMPLETE 4+ VIEW COMPARISON:  Non FINDINGS: Osseous demineralization. Joint space narrowing. No acute fracture, dislocation, or bone destruction. No knee joint effusion. Scattered vascular calcifications. IMPRESSION: Osseous demineralization with mild degenerative changes. No acute bony abnormalities identified. Electronically Signed   By: Ulyses Southward  M.D.   On: 07/06/2016 13:21    EKG: Independently reviewed. Sinus rhythm with PVCs.  No acute ST segment elevations.  Assessment/Plan Active Problems:   Diarrhea   Fall   Dementia   HTN (hypertension)   Bipolar disorder (HCC)   History of CVA (cerebrovascular accident)   RA (rheumatoid arthritis) (HCC)   AKI (acute kidney injury) (HCC)   Dehydration   Anemia   Hypokalemia   Decubitus ulcer of sacral region, stage 1   Tinea      Fall with face laceration --Laceration repaired in the ED --Fall precautions --No longer safe to live independently --Will need SNF referral  AKI (presumed) with U/A that suggest dehydration --NS at 125 cc/hr, repeat labs in the AM --Strict I/O  RA --Hold methotrexate for now --Continue daily prednisone dose  Hypokalemia --Replacement ordered  Diarrhea --Enteric precautions --Stool C diff PCR, GI pathogen panel pending  History of dementia, bipolar --Continue home doses of Aricept, perphenazine, seroquel  Tinea, skin breakdown --Nystatin powder BID --Wound care consult    DVT prophylaxis: SCDs Code Status: FULL Family Communication: I spoke with patient's legal guardian [place] [phone]. Disposition Plan: SNF Consults called: NONE Admission status: Inpatient, med  surg   TIME SPENT: 75 minutes   Jerene Bearsarter,Kiyana Vazguez Harrison MD [facility] Pager [phone]  If 7PM-7AM, please contact night-coverage [email or web address] Password [email or web address]  07/06/2016, 7:07 PM

## 2016-07-06 NOTE — Care Management (Signed)
ED CM spoke with patient at bedside. He reports living alone at a ALF in [place].  Noted in record patient is being evaluated for SNF placement at [facility]. Chrislyn RN, states  patient is having diarrhea, weak, unsteady gait, with elevated WBC. ED evaluation still in progress by  Langston Masker NP EDP

## 2016-07-06 NOTE — ED Notes (Signed)
[employer] (Child psychotherapist):  Office: [phone] Cell: [phone]

## 2016-07-06 NOTE — ED Notes (Signed)
Pt was bladder scanned with the highest result of 91mL. Informed Chrislyn - RN.

## 2016-07-06 NOTE — ED Notes (Signed)
Report called to Tess, RN at this time.  Receiving nurse denies having any further questions at this time.

## 2016-07-07 ENCOUNTER — Inpatient Hospital Stay (HOSPITAL_COMMUNITY): Payer: Medicare HMO

## 2016-07-07 DIAGNOSIS — W19XXXD Unspecified fall, subsequent encounter: Secondary | ICD-10-CM

## 2016-07-07 DIAGNOSIS — I10 Essential (primary) hypertension: Secondary | ICD-10-CM

## 2016-07-07 DIAGNOSIS — E876 Hypokalemia: Secondary | ICD-10-CM

## 2016-07-07 LAB — CBC
HEMATOCRIT: 30.4 % — AB (ref 39.0–52.0)
Hemoglobin: 10 g/dL — ABNORMAL LOW (ref 13.0–17.0)
MCH: 29.3 pg (ref 26.0–34.0)
MCHC: 32.9 g/dL (ref 30.0–36.0)
MCV: 89.1 fL (ref 78.0–100.0)
PLATELETS: 143 10*3/uL — AB (ref 150–400)
RBC: 3.41 MIL/uL — ABNORMAL LOW (ref 4.22–5.81)
RDW: 19.3 % — AB (ref 11.5–15.5)
WBC: 10.7 10*3/uL — ABNORMAL HIGH (ref 4.0–10.5)

## 2016-07-07 LAB — COMPREHENSIVE METABOLIC PANEL
ALT: 10 U/L — ABNORMAL LOW (ref 17–63)
AST: 23 U/L (ref 15–41)
Albumin: 2 g/dL — ABNORMAL LOW (ref 3.5–5.0)
Alkaline Phosphatase: 74 U/L (ref 38–126)
Anion gap: 9 (ref 5–15)
BILIRUBIN TOTAL: 0.8 mg/dL (ref 0.3–1.2)
BUN: 14 mg/dL (ref 6–20)
CHLORIDE: 105 mmol/L (ref 101–111)
CO2: 20 mmol/L — ABNORMAL LOW (ref 22–32)
Calcium: 7.4 mg/dL — ABNORMAL LOW (ref 8.9–10.3)
Creatinine, Ser: 1.56 mg/dL — ABNORMAL HIGH (ref 0.61–1.24)
GFR, EST AFRICAN AMERICAN: 50 mL/min — AB (ref >60)
GFR, EST NON AFRICAN AMERICAN: 43 mL/min — AB (ref >60)
Glucose, Bld: 99 mg/dL (ref 65–99)
POTASSIUM: 2.9 mmol/L — AB (ref 3.5–5.1)
Sodium: 134 mmol/L — ABNORMAL LOW (ref 135–145)
TOTAL PROTEIN: 5.1 g/dL — AB (ref 6.5–8.1)

## 2016-07-07 LAB — GASTROINTESTINAL PANEL BY PCR, STOOL (REPLACES STOOL CULTURE)
ADENOVIRUS F40/41: NOT DETECTED
Astrovirus: NOT DETECTED
CAMPYLOBACTER SPECIES: NOT DETECTED
Cryptosporidium: NOT DETECTED
Cyclospora cayetanensis: NOT DETECTED
ENTEROAGGREGATIVE E COLI (EAEC): NOT DETECTED
ENTEROPATHOGENIC E COLI (EPEC): NOT DETECTED
Entamoeba histolytica: NOT DETECTED
Enterotoxigenic E coli (ETEC): NOT DETECTED
GIARDIA LAMBLIA: NOT DETECTED
NOROVIRUS GI/GII: NOT DETECTED
Plesimonas shigelloides: NOT DETECTED
ROTAVIRUS A: NOT DETECTED
SHIGA LIKE TOXIN PRODUCING E COLI (STEC): NOT DETECTED
SHIGELLA/ENTEROINVASIVE E COLI (EIEC): NOT DETECTED
Salmonella species: NOT DETECTED
Sapovirus (I, II, IV, and V): NOT DETECTED
VIBRIO CHOLERAE: NOT DETECTED
Vibrio species: NOT DETECTED
Yersinia enterocolitica: NOT DETECTED

## 2016-07-07 LAB — URINE CULTURE

## 2016-07-07 LAB — BASIC METABOLIC PANEL
Anion gap: 6 (ref 5–15)
BUN: 14 mg/dL (ref 6–20)
CO2: 23 mmol/L (ref 22–32)
CREATININE: 1.39 mg/dL — AB (ref 0.61–1.24)
Calcium: 7.7 mg/dL — ABNORMAL LOW (ref 8.9–10.3)
Chloride: 110 mmol/L (ref 101–111)
GFR calc Af Amer: 57 mL/min — ABNORMAL LOW (ref >60)
GFR, EST NON AFRICAN AMERICAN: 49 mL/min — AB (ref >60)
GLUCOSE: 92 mg/dL (ref 65–99)
POTASSIUM: 3.8 mmol/L (ref 3.5–5.1)
SODIUM: 139 mmol/L (ref 135–145)

## 2016-07-07 LAB — MAGNESIUM: MAGNESIUM: 1.7 mg/dL (ref 1.7–2.4)

## 2016-07-07 LAB — MRSA PCR SCREENING: MRSA by PCR: NEGATIVE

## 2016-07-07 MED ORDER — POTASSIUM CHLORIDE CRYS ER 20 MEQ PO TBCR
30.0000 meq | EXTENDED_RELEASE_TABLET | ORAL | Status: AC
  Administered 2016-07-07 (×2): 30 meq via ORAL
  Filled 2016-07-07 (×2): qty 1

## 2016-07-07 MED ORDER — SODIUM CHLORIDE 0.9 % IV SOLN
INTRAVENOUS | Status: DC
  Administered 2016-07-07: 20:00:00 via INTRAVENOUS
  Filled 2016-07-07 (×2): qty 1000

## 2016-07-07 MED ORDER — BOOST / RESOURCE BREEZE PO LIQD
1.0000 | Freq: Three times a day (TID) | ORAL | Status: DC
Start: 2016-07-07 — End: 2016-07-12
  Administered 2016-07-07 – 2016-07-12 (×12): 1 via ORAL

## 2016-07-07 MED ORDER — VANCOMYCIN 50 MG/ML ORAL SOLUTION
125.0000 mg | Freq: Four times a day (QID) | ORAL | Status: DC
  Administered 2016-07-07 – 2016-07-12 (×24): 125 mg via ORAL
  Filled 2016-07-07 (×25): qty 2.5

## 2016-07-07 MED ORDER — SACCHAROMYCES BOULARDII 250 MG PO CAPS
250.0000 mg | ORAL_CAPSULE | Freq: Two times a day (BID) | ORAL | Status: DC
  Administered 2016-07-07 – 2016-07-12 (×12): 250 mg via ORAL
  Filled 2016-07-07 (×12): qty 1

## 2016-07-07 NOTE — Consult Note (Addendum)
WOC Nurse wound consult note Reason for Consult: Consult requested for buttocks.  Pt is having frequent diarrhea stools related to possible C-diff Wound type: Inner buttocks near rectum are red, macerated, with partial thickness skin loss.  Appearance is consistent with moisture associated skin damage. Pressure Ulcer POA: This is NOT a pressure injury. Measurement: Affected area is approx 6X6cm Wound bed: Red and moist Dressing procedure/placement/frequency: Barrier cream to repel moisture and protect skin.   Please re-consult if further assistance is needed.  Thank-you,  Cammie Mcgee MSN, RN, CWOCN, [place], CNS [phone]

## 2016-07-07 NOTE — Progress Notes (Signed)
PROGRESS NOTE  Paul Hansen  MBW:[id number] DOB: 06-12-1946  DOA: 07/06/2016 PCP: Dan Maker, MD   Brief Narrative:  70 y.o. gentleman with a history of RA on methotrexate and prednisone, vascular dementia, bipolar disorder, prior CVA, and HTN who was referred to the ED after having a fall in his apartment (unwitnessed) that resulted in a laceration over his right eye.  He denies loss of consciousness. In the ED noted to have 3 episodes of diarrhea. Initially denied history of diarrhea at home but then stated that he's been having diarrhea for 2 days. Limited historian due to her history of dementia. Imaging including right forearm xray, right knee xray, and CT of the head and cervical spine do not show acute fractures.  His right eyebrow laceration has been sutured. Subsequently ruled in for C. difficile.   Assessment & Plan:   Active Problems:   Diarrhea   Fall   Dementia   HTN (hypertension)   Bipolar disorder (HCC)   History of CVA (cerebrovascular accident)   RA (rheumatoid arthritis) (HCC)   AKI (acute kidney injury) (HCC)   Dehydration   Anemia   Hypokalemia   Decubitus ulcer of sacral region, stage 1   Tinea   Fall with face laceration - Laceration over right eyebrow sutured in ED. Determined to be no longer safe to live independently and will need SNF. - PT and OT evaluation.  C. difficile diarrhea - Continue oral vancomycin. Add probiotics. Monitor.  Severe hypokalemia - Replacing aggressively. Follow BMP and replace as needed. Magnesium 1.7.  Acute kidney injury - Felt to be due to dehydration from GI losses and poor oral intake. IV fluids and follow BMP.  Rheumatoid arthritis - Holding methotrexate. Continue prednisone.  History of dementia and bipolar disorder - Continuing home medications of Aricept, perphenazine and Seroquel.  Right leg wound - Continue topical wound care.  Anemia and thrombocytopenia - Follow CBCs.    DVT prophylaxis: SCDs Code  Status: Full Family Communication: None at bedside Disposition Plan: To be determined. Possibly SNF when medically ready   Consultants:   None  Procedures:   None  Antimicrobials:   Oral vancomycin    Subjective: Poor historian. Pleasantly confused. Gives 2 days history of diarrhea. Denies pain or any other complaints. As per RN, 3 loose BMs since this morning.  Objective:  Vitals:   07/06/16 1950 07/06/16 2010 07/07/16 0625 07/07/16 1412  BP: 125/85 132/83 98/63 113/88  Pulse: 91 85 60 61  Resp:  20 17 18   Temp:  98.5 F (36.9 C) 98.4 F (36.9 C) 97.8 F (36.6 C)  TempSrc:  Oral  Oral  SpO2: 96% 100%  100%  Weight:  52.4 kg (115 lb 8 oz)    Height:  5\' 11"  (1.803 m)      Intake/Output Summary (Last 24 hours) at 07/07/16 1735 Last data filed at 07/07/16 1107  Gross per 24 hour  Intake           1797.5 ml  Output              750 ml  Net           1047.5 ml   Filed Weights   07/06/16 2010  Weight: 52.4 kg (115 lb 8 oz)    Examination:  General exam: Pleasant elderly male, chronically ill looking, lying comfortably supine in bed. HEENT: Laceration sutured over right eyebrow. Small superficial cut on mid nose. Respiratory system: Clear to auscultation. Respiratory effort  normal. Cardiovascular system: S1 & S2 heard, RRR. No JVD, murmurs, rubs, gallops or clicks. No pedal edema. Gastrointestinal system: Abdomen is nondistended, soft and nontender. No organomegaly or masses felt. Normal bowel sounds heard. Central nervous system: Alert and oriented only to self. No focal neurological deficits. Extremities: Symmetric 5 x 5 power. Multiple superficial bruises in different stages on legs and the superficial wound on right upper leg. Skin: No rashes, lesions or ulcers Psychiatry: Judgement and insight appear impaired. Mood & affect appropriate.     Data Reviewed: I have personally reviewed following labs and imaging studies  CBC:  Recent Labs Lab  07/06/16 1356 07/07/16 0339  WBC 17.5* 10.7*  NEUTROABS 15.9*  --   HGB 12.2* 10.0*  HCT 35.7* 30.4*  MCV 88.1 89.1  PLT 197 143*   Basic Metabolic Panel:  Recent Labs Lab 07/06/16 1356 07/07/16 0339 07/07/16 0608  NA 135 134*  --   K 3.1* 2.9*  --   CL 100* 105  --   CO2 24 20*  --   GLUCOSE 103* 99  --   BUN 15 14  --   CREATININE 1.61* 1.56*  --   CALCIUM 8.3* 7.4*  --   MG  --   --  1.7   GFR: Estimated Creatinine Clearance: 32.2 mL/min (by C-G formula based on SCr of 1.56 mg/dL). Liver Function Tests:  Recent Labs Lab 07/07/16 0339  AST 23  ALT 10*  ALKPHOS 74  BILITOT 0.8  PROT 5.1*  ALBUMIN 2.0*   No results for input(s): LIPASE, AMYLASE in the last 168 hours. No results for input(s): AMMONIA in the last 168 hours. Coagulation Profile: No results for input(s): INR, PROTIME in the last 168 hours. Cardiac Enzymes:  Recent Labs Lab 07/06/16 1356  CKTOTAL 225   BNP (last 3 results) No results for input(s): PROBNP in the last 8760 hours. HbA1C: No results for input(s): HGBA1C in the last 72 hours. CBG: No results for input(s): GLUCAP in the last 168 hours. Lipid Profile: No results for input(s): CHOL, HDL, LDLCALC, TRIG, CHOLHDL, LDLDIRECT in the last 72 hours. Thyroid Function Tests: No results for input(s): TSH, T4TOTAL, FREET4, T3FREE, THYROIDAB in the last 72 hours. Anemia Panel: No results for input(s): VITAMINB12, FOLATE, FERRITIN, TIBC, IRON, RETICCTPCT in the last 72 hours.  Sepsis Labs:  Recent Labs Lab 07/06/16 1412  LATICACIDVEN 1.34    Recent Results (from the past 240 hour(s))  Urine culture     Status: Abnormal   Collection Time: 07/06/16  4:27 PM  Result Value Ref Range Status   Specimen Description URINE, RANDOM  Final   Special Requests NONE  Final   Culture <10,000 COLONIES/mL INSIGNIFICANT GROWTH (A)  Final   Report Status 07/07/2016 FINAL  Final  Gastrointestinal Panel by PCR , Stool     Status: None   Collection  Time: 07/06/16  6:10 PM  Result Value Ref Range Status   Campylobacter species NOT DETECTED NOT DETECTED Final   Plesimonas shigelloides NOT DETECTED NOT DETECTED Final   Salmonella species NOT DETECTED NOT DETECTED Final   Yersinia enterocolitica NOT DETECTED NOT DETECTED Final   Vibrio species NOT DETECTED NOT DETECTED Final   Vibrio cholerae NOT DETECTED NOT DETECTED Final   Enteroaggregative E coli (EAEC) NOT DETECTED NOT DETECTED Final   Enteropathogenic E coli (EPEC) NOT DETECTED NOT DETECTED Final   Enterotoxigenic E coli (ETEC) NOT DETECTED NOT DETECTED Final   Shiga like toxin producing E coli (STEC)  NOT DETECTED NOT DETECTED Final   Shigella/Enteroinvasive E coli (EIEC) NOT DETECTED NOT DETECTED Final   Cryptosporidium NOT DETECTED NOT DETECTED Final   Cyclospora cayetanensis NOT DETECTED NOT DETECTED Final   Entamoeba histolytica NOT DETECTED NOT DETECTED Final   Giardia lamblia NOT DETECTED NOT DETECTED Final   Adenovirus F40/41 NOT DETECTED NOT DETECTED Final   Astrovirus NOT DETECTED NOT DETECTED Final   Norovirus GI/GII NOT DETECTED NOT DETECTED Final   Rotavirus A NOT DETECTED NOT DETECTED Final   Sapovirus (I, II, IV, and V) NOT DETECTED NOT DETECTED Final  C difficile quick scan w PCR reflex     Status: Abnormal   Collection Time: 07/06/16  7:04 PM  Result Value Ref Range Status   C Diff antigen POSITIVE (A) NEGATIVE Final   C Diff toxin POSITIVE (A) NEGATIVE Final   C Diff interpretation Toxin producing C. difficile detected.  Final    Comment: CRITICAL RESULT CALLED TO, READ BACK BY AND VERIFIED WITH: Neill Loft RN [id number] 07/06/16 A BROWNING   MRSA PCR Screening     Status: None   Collection Time: 07/07/16  4:20 AM  Result Value Ref Range Status   MRSA by PCR NEGATIVE NEGATIVE Final    Comment:        The GeneXpert MRSA Assay (FDA approved for NASAL specimens only), is one component of a comprehensive MRSA colonization surveillance program. It is not intended  to diagnose MRSA infection nor to guide or monitor treatment for MRSA infections.          Radiology Studies: Dg Forearm Right  Result Date: 07/06/2016 CLINICAL DATA:  Larey Seat today.  Right forearm pain. EXAM: RIGHT FOREARM - 2 VIEW COMPARISON:  None. FINDINGS: The wrist and elbow joints are maintained. No acute forearm fracture. IMPRESSION: No acute fracture. Electronically Signed   By: Rudie Meyer M.D.   On: 07/06/2016 13:19   Ct Head Wo Contrast  Result Date: 07/06/2016 CLINICAL DATA:  Fall last night. Laceration above the right eye. Initial encounter. EXAM: CT HEAD WITHOUT CONTRAST CT CERVICAL SPINE WITHOUT CONTRAST TECHNIQUE: Multidetector CT imaging of the head and cervical spine was performed following the standard protocol without intravenous contrast. Multiplanar CT image reconstructions of the cervical spine were also generated. COMPARISON:  Brain MRI 12/03/2009. FINDINGS: CT HEAD FINDINGS Brain: There is moderate cerebral atrophy with greatest involvement of the parietal lobes. 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 There is no evidence of acute cortical infarct, intracranial hemorrhage, mass, midline shift, or extra-axial fluid collection. Patchy to confluent cerebral white matter hypodensities are nonspecific but compatible with moderate chronic small vessel ischemic disease, advanced for age. Vascular: Calcified atherosclerosis at the skullbase. Skull: No fracture identified. Sinuses/Orbits: Visualized paranasal sinuses and mastoid air cells are clear. Orbits are unremarkable. Other: Mild supraorbital soft tissue swelling. CT CERVICAL SPINE FINDINGS Alignment: No evidence of traumatic subluxation. Skull base and vertebrae: No fracture or destructive osseous lesion. Soft tissues and spinal canal: No prevertebral edema. Mild right-sided cervical atherosclerosis. Disc levels:  Mild cervical spondylosis, most notable at C6-7. Upper chest: Advanced  centrilobular emphysema. Right greater than left apical lung scarring. Other: None. IMPRESSION: 1. No evidence of acute intracranial abnormality. 2. Mild right supraorbital soft tissue swelling. 3. Moderately advanced chronic small vessel ischemic disease. 4. No acute abnormality identified in the cervical spine. Electronically Signed   By: Sebastian Ache M.D.   On: 07/06/2016 13:51   Ct Cervical Spine Wo Contrast  Result Date: 07/06/2016 CLINICAL DATA:  Fall last night. Laceration above the right eye. Initial encounter. EXAM: CT HEAD WITHOUT CONTRAST CT CERVICAL SPINE WITHOUT CONTRAST TECHNIQUE: Multidetector CT imaging of the head and cervical spine was performed following the standard protocol without intravenous contrast. Multiplanar CT image reconstructions of the cervical spine were also generated. COMPARISON:  Brain MRI 12/03/2009. FINDINGS: CT HEAD FINDINGS Brain: There is moderate cerebral atrophy with greatest involvement of the parietal lobes. 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 There is no evidence of acute cortical infarct, intracranial hemorrhage, mass, midline shift, or extra-axial fluid collection. Patchy to confluent cerebral white matter hypodensities are nonspecific but compatible with moderate chronic small vessel ischemic disease, advanced for age. Vascular: Calcified atherosclerosis at the skullbase. Skull: No fracture identified. Sinuses/Orbits: Visualized paranasal sinuses and mastoid air cells are clear. Orbits are unremarkable. Other: Mild supraorbital soft tissue swelling. CT CERVICAL SPINE FINDINGS Alignment: No evidence of traumatic subluxation. Skull base and vertebrae: No fracture or destructive osseous lesion. Soft tissues and spinal canal: No prevertebral edema. Mild right-sided cervical atherosclerosis. Disc levels:  Mild cervical spondylosis, most notable at C6-7. Upper chest: Advanced centrilobular emphysema. Right greater than left apical lung  scarring. Other: None. IMPRESSION: 1. No evidence of acute intracranial abnormality. 2. Mild right supraorbital soft tissue swelling. 3. Moderately advanced chronic small vessel ischemic disease. 4. No acute abnormality identified in the cervical spine. Electronically Signed   By: Sebastian Ache M.D.   On: 07/06/2016 13:51   Dg Chest Port 1 View  Result Date: 07/07/2016 CLINICAL DATA:  Recent fall EXAM: PORTABLE CHEST 1 VIEW COMPARISON:  10/12/2015 FINDINGS: Cardiac shadow is within normal limits. The lungs are again hyperinflated. Some chronic scarring is noted in the bases bilaterally. No focal infiltrate or sizable effusion is seen. No acute bony abnormality is noted. IMPRESSION: No acute abnormality seen. Electronically Signed   By: Alcide Clever M.D.   On: 07/07/2016 08:52   Dg Knee Complete 4 Views Right  Result Date: 07/06/2016 CLINICAL DATA:  Tripped over own feet and fell, weakness, anterior RIGHT laceration EXAM: RIGHT KNEE - COMPLETE 4+ VIEW COMPARISON:  Non FINDINGS: Osseous demineralization. Joint space narrowing. No acute fracture, dislocation, or bone destruction. No knee joint effusion. Scattered vascular calcifications. IMPRESSION: Osseous demineralization with mild degenerative changes. No acute bony abnormalities identified. Electronically Signed   By: Ulyses Southward M.D.   On: 07/06/2016 13:21        Scheduled Meds: . donepezil  10 mg Oral QHS  . feeding supplement  1 Container Oral TID BM  . finasteride  5 mg Oral Daily  . fluticasone furoate-vilanterol  1 puff Inhalation Daily  . folic acid  1 mg Oral Daily  . metoprolol tartrate  50 mg Oral BID  . nicotine  14 mg Transdermal Daily  . nystatin   Topical BID  . perphenazine  4 mg Oral Q breakfast  . perphenazine  8 mg Oral QHS  . potassium chloride  10 mEq Oral Daily  . predniSONE  5 mg Oral Q breakfast  . QUEtiapine  50 mg Oral QHS  . saccharomyces boulardii  250 mg Oral BID  . vancomycin  125 mg Oral QID   Continuous  Infusions: . sodium chloride 1 mL (07/07/16 0654)     LOS: 1 day    Time spent: 35 minutes.    [facility], MD [facility] Pager [phone]  If 7PM-7AM, please contact night-coverage [email or web address] Password [facility] 07/07/2016,  5:35 PM

## 2016-07-07 NOTE — Progress Notes (Signed)
Initial Nutrition Assessment  DOCUMENTATION CODES:   Underweight, Severe malnutrition in context of chronic illness  INTERVENTION:   -Boost Breeze po TID, each supplement provides 250 kcal and 9 grams of protein  NUTRITION DIAGNOSIS:   Malnutrition related to chronic illness as evidenced by severe depletion of body fat, severe depletion of muscle mass.  GOAL:   Patient will meet greater than or equal to 90% of their needs  MONITOR:   PO intake, Supplement acceptance, Labs, Weight trends, Skin, I & O's  REASON FOR ASSESSMENT:   Other (Comment)    ASSESSMENT:   Paul Hansen is a 70 y.o. gentleman with a history of RA on methotrexate and prednisone, vascular dementia, bipolar disorder, prior CVA, and HTN who was referred to the ED after having a fall in his apartment (unwitnessed) that resulted in a laceration over his right eye.  He denies loss of consciousness.    RD pulled to chart due to underweight status.   Pt admitted s/p fall with face lacerations. Per chart review, pt resides in ALF but is working on transfer to SNF.    Spoke with pt at bedside. He reports good appetite currently. He consumed the fish and cake on his meal tray. Pt did not eat the carrots or potatoes; when questioned pt pointed to the carrots and stated "I don't like that". Pt reports she generally consumes 3 meals per day which are provided by his residence. He estimates he consumes about 75% of his meals on average.   Pt reports that he has lost weight (around 10#) however, unable to provide time frame for weight loss. Reports UBW is around 140#. No wt hx available to confirm wt changes.   Nutrition-Focused physical exam completed. Findings are moderate to severe fat depletion, moderate to severe muscle depletion, and no edema.   Discussed importance of good PO intake to support healing. Pt reports he enjoys sweet foods. RD will order Boost Breeze.   Reviewed CWOCN note from 07/07/16; pt with MASD on  inner buttocks.   Medications reviewed and include folvite and KCl.   Labs reviewed: Na: 134 (on IV supplementation), K: 2.9 (on PO supplementation).   Diet Order:  Diet Heart Room service appropriate? Yes; Fluid consistency: Thin  Skin:  Wound (see comment) (MASD buttocks)  Last BM:  07/07/16  Height:   Ht Readings from Last 1 Encounters:  07/06/16 5\' 11"  (1.803 m)    Weight:   Wt Readings from Last 1 Encounters:  07/06/16 115 lb 8 oz (52.4 kg)    Ideal Body Weight:  78.2 kg  BMI:  Body mass index is 16.11 kg/m.  Estimated Nutritional Needs:   Kcal:  1600-1800  Protein:  75-90 grams  Fluid:  1.6-1.8 L  EDUCATION NEEDS:   Education needs addressed  Paul Hansen A. 09/05/16, RD, LDN, CDE Pager: [phone] After hours Pager: [phone]

## 2016-07-08 DIAGNOSIS — A047 Enterocolitis due to Clostridium difficile: Principal | ICD-10-CM

## 2016-07-08 LAB — CBC
HCT: 31.5 % — ABNORMAL LOW (ref 39.0–52.0)
HEMOGLOBIN: 10.3 g/dL — AB (ref 13.0–17.0)
MCH: 29.5 pg (ref 26.0–34.0)
MCHC: 32.7 g/dL (ref 30.0–36.0)
MCV: 90.3 fL (ref 78.0–100.0)
PLATELETS: 151 10*3/uL (ref 150–400)
RBC: 3.49 MIL/uL — AB (ref 4.22–5.81)
RDW: 19.7 % — ABNORMAL HIGH (ref 11.5–15.5)
WBC: 10.2 10*3/uL (ref 4.0–10.5)

## 2016-07-08 LAB — BASIC METABOLIC PANEL
ANION GAP: 5 (ref 5–15)
BUN: 13 mg/dL (ref 6–20)
CALCIUM: 7.6 mg/dL — AB (ref 8.9–10.3)
CHLORIDE: 114 mmol/L — AB (ref 101–111)
CO2: 21 mmol/L — ABNORMAL LOW (ref 22–32)
CREATININE: 1.22 mg/dL (ref 0.61–1.24)
GFR calc non Af Amer: 58 mL/min — ABNORMAL LOW (ref >60)
Glucose, Bld: 82 mg/dL (ref 65–99)
Potassium: 3.8 mmol/L (ref 3.5–5.1)
SODIUM: 140 mmol/L (ref 135–145)

## 2016-07-08 NOTE — Evaluation (Signed)
Physical Therapy Evaluation Patient Details Name: Paul Hansen MRN: [id number] DOB: 07/08/1946 Today's Date: 07/08/2016   History of Present Illness  Pt is a 70 y/o male admitted following a fall at home which resulted in a laceration over his R eye requiring stitches. Pt denies LOC. PMH including but not limited to vascular dementia, bipolar disorder, HTN, RA and hx of CVA.  Clinical Impression  Pt presented supine in bed with HOB elevated, awake and willing to participate in therapy session. Prior to admission, pt reported that he used a standard walker to ambulate; however, uncertain if further assistance was required as the pt was not the best historian. Pt would continue to benefit from skilled physical therapy services at this time while admitted and after d/c to address his below listed limitations in order to improve his overall safety and independence with functional mobility.     Follow Up Recommendations SNF    Equipment Recommendations  None recommended by PT    Recommendations for Other Services       Precautions / Restrictions Precautions Precautions: Fall Restrictions Weight Bearing Restrictions: No      Mobility  Bed Mobility Overal bed mobility: Needs Assistance Bed Mobility: Supine to Sit     Supine to sit: HOB elevated;Mod assist     General bed mobility comments: pt required mod A for upper body and min A for bilateral LEs to achieve sitting EOB. pt also using bed rails to assist.  Transfers Overall transfer level: Needs assistance Equipment used: 2 person hand held assist Transfers: Sit to/from Stand Sit to Stand: Min assist;+2 safety/equipment         General transfer comment: pt required increased time and min A to achieve full standing position  Ambulation/Gait Ambulation/Gait assistance: Min assist;+2 safety/equipment Ambulation Distance (Feet): 5 Feet Assistive device: 2 person hand held assist Gait Pattern/deviations: Step-through  pattern;Decreased stride length;Trunk flexed Gait velocity: decreased      Stairs            Wheelchair Mobility    Modified Rankin (Stroke Patients Only)       Balance Overall balance assessment: Needs assistance Sitting-balance support: Feet supported;Bilateral upper extremity supported Sitting balance-Leahy Scale: Poor     Standing balance support: During functional activity;Bilateral upper extremity supported Standing balance-Leahy Scale: Poor                               Pertinent Vitals/Pain Pain Assessment: No/denies pain    Home Living Family/patient expects to be discharged to:: Unsure Living Arrangements: Alone               Additional Comments: pt previously lived alone in an apartment; however, unsure of d/c dispo secondary to pt being a poor historian.    Prior Function Level of Independence: Independent with assistive device(s)         Comments: pt stated that he used a standard walker to ambulate with prior to admission. Unsure if assistance was needed for ADLs.     Hand Dominance        Extremity/Trunk Assessment   Upper Extremity Assessment: Overall WFL for tasks assessed           Lower Extremity Assessment: Generalized weakness         Communication   Communication: No difficulties  Cognition Arousal/Alertness: Awake/alert Behavior During Therapy: WFL for tasks assessed/performed Overall Cognitive Status: No family/caregiver present to determine baseline cognitive functioning  General Comments      Exercises        Assessment/Plan    PT Assessment Patient needs continued PT services  PT Diagnosis Difficulty walking;Generalized weakness   PT Problem List Decreased strength;Decreased range of motion;Decreased activity tolerance;Decreased balance;Decreased mobility;Decreased coordination;Decreased knowledge of use of DME  PT Treatment Interventions DME instruction;Gait  training;Stair training;Functional mobility training;Therapeutic activities;Therapeutic exercise;Balance training;Neuromuscular re-education;Patient/family education   PT Goals (Current goals can be found in the Care Plan section) Acute Rehab PT Goals Patient Stated Goal: none stated PT Goal Formulation: With patient Time For Goal Achievement: 07/15/16 Potential to Achieve Goals: Good    Frequency Min 3X/week   Barriers to discharge        Co-evaluation               End of Session Equipment Utilized During Treatment: Gait belt Activity Tolerance: Patient limited by fatigue Patient left: in chair;with call bell/phone within reach;with chair alarm set Nurse Communication: Mobility status         Time: 4193-7902 PT Time Calculation (min) (ACUTE ONLY): 17 min   Charges:   PT Evaluation $PT Eval Moderate Complexity: 1 Procedure     PT G CodesAlessandra Bevels Irelyn Perfecto 07/08/2016, 10:17 AM

## 2016-07-08 NOTE — Progress Notes (Addendum)
PROGRESS NOTE  Paul Hansen  FTD:[id number] DOB: 05-Apr-1946  DOA: 07/06/2016 PCP: Dan Maker, MD   Brief Narrative:  70 y.o. gentleman with a history of RA on methotrexate and prednisone, vascular dementia, bipolar disorder, prior CVA, and HTN who was referred to the ED after having a fall in his apartment (unwitnessed) that resulted in a laceration over his right eye.  He denies loss of consciousness. In the ED noted to have 3 episodes of diarrhea. Initially denied history of diarrhea at home but then stated that he's been having diarrhea for 2 days. Limited historian due to her history of dementia. Imaging including right forearm xray, right knee xray, and CT of the head and cervical spine do not show acute fractures.  His right eyebrow laceration has been sutured. Subsequently ruled in for C. difficile.   Assessment & Plan:   Active Problems:   Diarrhea   Fall   Dementia   HTN (hypertension)   Bipolar disorder (HCC)   History of CVA (cerebrovascular accident)   RA (rheumatoid arthritis) (HCC)   AKI (acute kidney injury) (HCC)   Dehydration   Anemia   Hypokalemia   Decubitus ulcer of sacral region, stage 1   Tinea   Fall with face laceration - Laceration over right eyebrow sutured in ED. Determined to be no longer safe to live independently and will need SNF. - PT and OT evaluation. Recommend SNF.  C. difficile diarrhea - Continue oral vancomycin. Add probiotics. Monitor. - Improving with decreased diarrhea.  Severe hypokalemia - Replaced. Magnesium 1.7.  Acute kidney injury - Felt to be due to dehydration from GI losses and poor oral intake. Resolved after IV fluids.  Rheumatoid arthritis - Holding methotrexate. Continue prednisone.  History of dementia and bipolar disorder - Continuing home medications of Aricept, perphenazine and Seroquel. Patient does not have capacity to make medical decisions. As per RN report, patient is ward of the state.  Right leg wound -  Continue topical wound care.  Anemia and thrombocytopenia - Hemoglobin stable. Thrombocytopenia resolved.    DVT prophylaxis: SCDs Code Status: Full Family Communication: None at bedside. Left message for Ms. Jewel, state POA [phone]). D/W Mr. Manuela Neptune, weekend SW with [facility] DSS. He will have a colleague call me back with information re sharing info with family and DC dispo to SNF. Disposition Plan: To be determined. Possibly SNF in 1-2 days   Consultants:   None  Procedures:   None  Antimicrobials:   Oral vancomycin    Subjective: Poor historian. Pleasantly confused. Denies complaints and wants to go home. As per RN, no BMs overnight or this morning.  Objective:  Vitals:   07/07/16 1412 07/07/16 2025 07/08/16 0515 07/08/16 1317  BP: 113/88 123/73 127/86 139/79  Pulse: 61 63 (!) 53 (!) 59  Resp: 18 19 20 18   Temp: 97.8 F (36.6 C) 97.6 F (36.4 C) 97.7 F (36.5 C) 97.7 F (36.5 C)  TempSrc: Oral Oral  Axillary  SpO2: 100% 100% 99% 100%  Weight:      Height:        Intake/Output Summary (Last 24 hours) at 07/08/16 1444 Last data filed at 07/08/16 1049  Gross per 24 hour  Intake             1680 ml  Output              200 ml  Net  1480 ml   Filed Weights   07/06/16 2010  Weight: 52.4 kg (115 lb 8 oz)    Examination:  General exam: Pleasant elderly male, chronically ill looking, Sitting up comfortably in the chair this morning. HEENT: Laceration sutured over right eyebrow. Small superficial cut on mid nose. Respiratory system: Clear to auscultation. Respiratory effort normal. Cardiovascular system: S1 & S2 heard, RRR. No JVD, murmurs, rubs, gallops or clicks. No pedal edema. Gastrointestinal system: Abdomen is nondistended, soft and nontender. No organomegaly or masses felt. Normal bowel sounds heard. Central nervous system: Alert and oriented only to self. No focal neurological deficits. Extremities: Symmetric 5 x 5  power. Multiple superficial bruises in different stages on legs and the superficial wound on right upper leg. Skin: No rashes, lesions or ulcers Psychiatry: Judgement and insight appear impaired. Mood & affect appropriate.     Data Reviewed: I have personally reviewed following labs and imaging studies  CBC:  Recent Labs Lab 07/06/16 1356 07/07/16 0339 07/08/16 0259  WBC 17.5* 10.7* 10.2  NEUTROABS 15.9*  --   --   HGB 12.2* 10.0* 10.3*  HCT 35.7* 30.4* 31.5*  MCV 88.1 89.1 90.3  PLT 197 143* 151   Basic Metabolic Panel:  Recent Labs Lab 07/06/16 1356 07/07/16 0339 07/07/16 0608 07/07/16 1946 07/08/16 0259  NA 135 134*  --  139 140  K 3.1* 2.9*  --  3.8 3.8  CL 100* 105  --  110 114*  CO2 24 20*  --  23 21*  GLUCOSE 103* 99  --  92 82  BUN 15 14  --  14 13  CREATININE 1.61* 1.56*  --  1.39* 1.22  CALCIUM 8.3* 7.4*  --  7.7* 7.6*  MG  --   --  1.7  --   --    GFR: Estimated Creatinine Clearance: 41.2 mL/min (by C-G formula based on SCr of 1.22 mg/dL). Liver Function Tests:  Recent Labs Lab 07/07/16 0339  AST 23  ALT 10*  ALKPHOS 74  BILITOT 0.8  PROT 5.1*  ALBUMIN 2.0*   No results for input(s): LIPASE, AMYLASE in the last 168 hours. No results for input(s): AMMONIA in the last 168 hours. Coagulation Profile: No results for input(s): INR, PROTIME in the last 168 hours. Cardiac Enzymes:  Recent Labs Lab 07/06/16 1356  CKTOTAL 225   BNP (last 3 results) No results for input(s): PROBNP in the last 8760 hours. HbA1C: No results for input(s): HGBA1C in the last 72 hours. CBG: No results for input(s): GLUCAP in the last 168 hours. Lipid Profile: No results for input(s): CHOL, HDL, LDLCALC, TRIG, CHOLHDL, LDLDIRECT in the last 72 hours. Thyroid Function Tests: No results for input(s): TSH, T4TOTAL, FREET4, T3FREE, THYROIDAB in the last 72 hours. Anemia Panel: No results for input(s): VITAMINB12, FOLATE, FERRITIN, TIBC, IRON, RETICCTPCT in the last  72 hours.  Sepsis Labs:  Recent Labs Lab 07/06/16 1412  LATICACIDVEN 1.34    Recent Results (from the past 240 hour(s))  Urine culture     Status: Abnormal   Collection Time: 07/06/16  4:27 PM  Result Value Ref Range Status   Specimen Description URINE, RANDOM  Final   Special Requests NONE  Final   Culture <10,000 COLONIES/mL INSIGNIFICANT GROWTH (A)  Final   Report Status 07/07/2016 FINAL  Final  Gastrointestinal Panel by PCR , Stool     Status: None   Collection Time: 07/06/16  6:10 PM  Result Value Ref Range Status  Campylobacter species NOT DETECTED NOT DETECTED Final   Plesimonas shigelloides NOT DETECTED NOT DETECTED Final   Salmonella species NOT DETECTED NOT DETECTED Final   Yersinia enterocolitica NOT DETECTED NOT DETECTED Final   Vibrio species NOT DETECTED NOT DETECTED Final   Vibrio cholerae NOT DETECTED NOT DETECTED Final   Enteroaggregative E coli (EAEC) NOT DETECTED NOT DETECTED Final   Enteropathogenic E coli (EPEC) NOT DETECTED NOT DETECTED Final   Enterotoxigenic E coli (ETEC) NOT DETECTED NOT DETECTED Final   Shiga like toxin producing E coli (STEC) NOT DETECTED NOT DETECTED Final   Shigella/Enteroinvasive E coli (EIEC) NOT DETECTED NOT DETECTED Final   Cryptosporidium NOT DETECTED NOT DETECTED Final   Cyclospora cayetanensis NOT DETECTED NOT DETECTED Final   Entamoeba histolytica NOT DETECTED NOT DETECTED Final   Giardia lamblia NOT DETECTED NOT DETECTED Final   Adenovirus F40/41 NOT DETECTED NOT DETECTED Final   Astrovirus NOT DETECTED NOT DETECTED Final   Norovirus GI/GII NOT DETECTED NOT DETECTED Final   Rotavirus A NOT DETECTED NOT DETECTED Final   Sapovirus (I, II, IV, and V) NOT DETECTED NOT DETECTED Final  C difficile quick scan w PCR reflex     Status: Abnormal   Collection Time: 07/06/16  7:04 PM  Result Value Ref Range Status   C Diff antigen POSITIVE (A) NEGATIVE Final   C Diff toxin POSITIVE (A) NEGATIVE Final   C Diff interpretation  Toxin producing C. difficile detected.  Final    Comment: CRITICAL RESULT CALLED TO, READ BACK BY AND VERIFIED WITH: Neill Loft RN 2044 07/06/16 A BROWNING   MRSA PCR Screening     Status: None   Collection Time: 07/07/16  4:20 AM  Result Value Ref Range Status   MRSA by PCR NEGATIVE NEGATIVE Final    Comment:        The GeneXpert MRSA Assay (FDA approved for NASAL specimens only), is one component of a comprehensive MRSA colonization surveillance program. It is not intended to diagnose MRSA infection nor to guide or monitor treatment for MRSA infections.          Radiology Studies: Dg Chest Port 1 View  Result Date: 07/07/2016 CLINICAL DATA:  Recent fall EXAM: PORTABLE CHEST 1 VIEW COMPARISON:  10/12/2015 FINDINGS: Cardiac shadow is within normal limits. The lungs are again hyperinflated. Some chronic scarring is noted in the bases bilaterally. No focal infiltrate or sizable effusion is seen. No acute bony abnormality is noted. IMPRESSION: No acute abnormality seen. Electronically Signed   By: Alcide Clever M.D.   On: 07/07/2016 08:52        Scheduled Meds: . donepezil  10 mg Oral QHS  . feeding supplement  1 Container Oral TID BM  . finasteride  5 mg Oral Daily  . fluticasone furoate-vilanterol  1 puff Inhalation Daily  . folic acid  1 mg Oral Daily  . metoprolol tartrate  50 mg Oral BID  . nicotine  14 mg Transdermal Daily  . nystatin   Topical BID  . perphenazine  4 mg Oral Q breakfast  . perphenazine  8 mg Oral QHS  . potassium chloride  10 mEq Oral Daily  . predniSONE  5 mg Oral Q breakfast  . QUEtiapine  50 mg Oral QHS  . saccharomyces boulardii  250 mg Oral BID  . vancomycin  125 mg Oral QID   Continuous Infusions:     LOS: 2 days    Time spent: 35 minutes.    Marcellus Scott, MD  [facility] Pager [phone]  If 7PM-7AM, please contact night-coverage [email or web address] Password [email or web address] 07/08/2016, 2:44 PM

## 2016-07-08 NOTE — Progress Notes (Signed)
Dr. Virginia Rochester is pt's PCP in [facility].

## 2016-07-08 NOTE — Progress Notes (Signed)
Addendum  Discussed with patient's DSS social worker Ms. Jewel [place]. She indicated that even prior to this hospitalization, patient lived in an independent apartment and plans were ongoing to try and move him to a skilled nursing facility because he was unable to care for himself and was felt to be unsafe by himself. She advised that we could share medical information with family but family cannot make any decisions on his behalf since he his a ward of the state.  Will consult clinical social worker to coordinate discharge to SNF when medically ready Eligha Bridegroom per Ms. Monroe).  Marcellus Scott, MD, FACP, FHM. [facility] Pager [phone]  If 7PM-7AM, please contact night-coverage [email or web address] Password [email or web address] 07/08/2016, 3:42 PM

## 2016-07-08 NOTE — Progress Notes (Signed)
Speaking to dtr of pt:  Paul Hansen at [phone] who states his POA is the state.  His case worker is :   Jewel at [phone].  The nurse who came in to assist and fixes his meds is:  Coralie Common at [phone] (cell) and office is [phone].  He is a part of the [employer] community health response program.   His doctor in [place] is Dr. Virginia Rochester with [facility].

## 2016-07-09 LAB — BASIC METABOLIC PANEL
ANION GAP: 6 (ref 5–15)
BUN: 12 mg/dL (ref 6–20)
CALCIUM: 8 mg/dL — AB (ref 8.9–10.3)
CO2: 20 mmol/L — AB (ref 22–32)
Chloride: 109 mmol/L (ref 101–111)
Creatinine, Ser: 1.19 mg/dL (ref 0.61–1.24)
GFR, EST NON AFRICAN AMERICAN: 60 mL/min — AB (ref >60)
GLUCOSE: 77 mg/dL (ref 65–99)
POTASSIUM: 4 mmol/L (ref 3.5–5.1)
Sodium: 135 mmol/L (ref 135–145)

## 2016-07-09 NOTE — Progress Notes (Signed)
PROGRESS NOTE  Paul Hansen  BTD:[id number] DOB: 1946/08/09  DOA: 07/06/2016 PCP: Dan Maker, MD   Brief Narrative:  70 y.o. gentleman with a history of RA on methotrexate and prednisone, vascular dementia, bipolar disorder, prior CVA, and HTN who was referred to the ED after having a fall in his apartment (unwitnessed) that resulted in a laceration over his right eye.  He denies loss of consciousness. In the ED noted to have 3 episodes of diarrhea. Initially denied history of diarrhea at home but then stated that he's been having diarrhea for 2 days. Limited historian due to her history of dementia. Imaging including right forearm xray, right knee xray, and CT of the head and cervical spine do not show acute fractures.  His right eyebrow laceration has been sutured. Subsequently ruled in for C. difficile.   Assessment & Plan:   Active Problems:   Diarrhea   Fall   Dementia   HTN (hypertension)   Bipolar disorder (HCC)   History of CVA (cerebrovascular accident)   RA (rheumatoid arthritis) (HCC)   AKI (acute kidney injury) (HCC)   Dehydration   Anemia   Hypokalemia   Decubitus ulcer of sacral region, stage 1   Tinea   Fall with face laceration - Laceration over right eyebrow sutured in ED. Determined to be no longer safe to live independently and will need SNF. - PT and OT evaluation. Recommend SNF.  C. difficile diarrhea - Continue oral vancomycin. Add probiotics. Monitor. - Improving with decreased diarrhea. No diarrhea recorded 9/19. One BM since this morning.  Severe hypokalemia - Replaced. Magnesium 1.7.  Acute kidney injury - Felt to be due to dehydration from GI losses and poor oral intake. Resolved after IV fluids.  Rheumatoid arthritis - Holding methotrexate. Continue prednisone.  History of dementia and bipolar disorder - Continuing home medications of Aricept, perphenazine and Seroquel. Patient does not have capacity to make medical decisions. As per RN  report, patient is ward of the state.  Right leg wound - Continue topical wound care.  Anemia and thrombocytopenia - Hemoglobin stable. Thrombocytopenia resolved.    DVT prophylaxis: SCDs Code Status: Full Family Communication: None at bedside. Discussed with patient's DSS social worker Ms. Jewel [place] on 9/9. Updated care and answered questions. Disposition Plan: To be determined. Possibly SNF 9/11   Consultants:   None  Procedures:   None  Antimicrobials:   Oral vancomycin    Subjective: Poor historian. Pleasantly confused. No diarrhea recorded 9/19. One BM since this morning.  Objective:  Vitals:   07/08/16 2143 07/09/16 0635 07/09/16 1043 07/09/16 1220  BP: (!) 138/91 (!) 142/80  114/70  Pulse: 65 64  (!) 56  Resp: 18 18  16   Temp: 97.5 F (36.4 C) 97.3 F (36.3 C)  97.7 F (36.5 C)  TempSrc: Oral   Axillary  SpO2: 99% 100% 99% 100%  Weight:      Height:        Intake/Output Summary (Last 24 hours) at 07/09/16 1544 Last data filed at 07/09/16 1416  Gross per 24 hour  Intake              240 ml  Output              675 ml  Net             -435 ml   Filed Weights   07/06/16 2010  Weight: 52.4 kg (115 lb 8 oz)    Examination:  General  exam: Pleasant elderly male, chronically ill looking, Lying comfortably in bed. HEENT: Laceration sutured over right eyebrow. Small superficial cut on mid nose. Respiratory system: Clear to auscultation. Respiratory effort normal. Cardiovascular system: S1 & S2 heard, RRR. No JVD, murmurs, rubs, gallops or clicks. No pedal edema. Gastrointestinal system: Abdomen is nondistended, soft and nontender. No organomegaly or masses felt. Normal bowel sounds heard. Central nervous system: Alert and oriented only to self. No focal neurological deficits. Extremities: Symmetric 5 x 5 power. Multiple superficial bruises in different stages on legs and the superficial wound on right upper leg. Skin: No rashes, lesions or  ulcers Psychiatry: Judgement and insight appear impaired. Mood & affect appropriate.     Data Reviewed: I have personally reviewed following labs and imaging studies  CBC:  Recent Labs Lab 07/06/16 1356 07/07/16 0339 07/08/16 0259  WBC 17.5* 10.7* 10.2  NEUTROABS 15.9*  --   --   HGB 12.2* 10.0* 10.3*  HCT 35.7* 30.4* 31.5*  MCV 88.1 89.1 90.3  PLT 197 143* 151   Basic Metabolic Panel:  Recent Labs Lab 07/06/16 1356 07/07/16 0339 07/07/16 0608 07/07/16 1946 07/08/16 0259 07/09/16 0529  NA 135 134*  --  139 140 135  K 3.1* 2.9*  --  3.8 3.8 4.0  CL 100* 105  --  110 114* 109  CO2 24 20*  --  23 21* 20*  GLUCOSE 103* 99  --  92 82 77  BUN 15 14  --  14 13 12   CREATININE 1.61* 1.56*  --  1.39* 1.22 1.19  CALCIUM 8.3* 7.4*  --  7.7* 7.6* 8.0*  MG  --   --  1.7  --   --   --    GFR: Estimated Creatinine Clearance: 42.2 mL/min (by C-G formula based on SCr of 1.19 mg/dL). Liver Function Tests:  Recent Labs Lab 07/07/16 0339  AST 23  ALT 10*  ALKPHOS 74  BILITOT 0.8  PROT 5.1*  ALBUMIN 2.0*   No results for input(s): LIPASE, AMYLASE in the last 168 hours. No results for input(s): AMMONIA in the last 168 hours. Coagulation Profile: No results for input(s): INR, PROTIME in the last 168 hours. Cardiac Enzymes:  Recent Labs Lab 07/06/16 1356  CKTOTAL 225   BNP (last 3 results) No results for input(s): PROBNP in the last 8760 hours. HbA1C: No results for input(s): HGBA1C in the last 72 hours. CBG: No results for input(s): GLUCAP in the last 168 hours. Lipid Profile: No results for input(s): CHOL, HDL, LDLCALC, TRIG, CHOLHDL, LDLDIRECT in the last 72 hours. Thyroid Function Tests: No results for input(s): TSH, T4TOTAL, FREET4, T3FREE, THYROIDAB in the last 72 hours. Anemia Panel: No results for input(s): VITAMINB12, FOLATE, FERRITIN, TIBC, IRON, RETICCTPCT in the last 72 hours.  Sepsis Labs:  Recent Labs Lab 07/06/16 1412  LATICACIDVEN 1.34     Recent Results (from the past 240 hour(s))  Urine culture     Status: Abnormal   Collection Time: 07/06/16  4:27 PM  Result Value Ref Range Status   Specimen Description URINE, RANDOM  Final   Special Requests NONE  Final   Culture <10,000 COLONIES/mL INSIGNIFICANT GROWTH (A)  Final   Report Status 07/07/2016 FINAL  Final  Gastrointestinal Panel by PCR , Stool     Status: None   Collection Time: 07/06/16  6:10 PM  Result Value Ref Range Status   Campylobacter species NOT DETECTED NOT DETECTED Final   Plesimonas shigelloides NOT DETECTED NOT DETECTED  Final   Salmonella species NOT DETECTED NOT DETECTED Final   Yersinia enterocolitica NOT DETECTED NOT DETECTED Final   Vibrio species NOT DETECTED NOT DETECTED Final   Vibrio cholerae NOT DETECTED NOT DETECTED Final   Enteroaggregative E coli (EAEC) NOT DETECTED NOT DETECTED Final   Enteropathogenic E coli (EPEC) NOT DETECTED NOT DETECTED Final   Enterotoxigenic E coli (ETEC) NOT DETECTED NOT DETECTED Final   Shiga like toxin producing E coli (STEC) NOT DETECTED NOT DETECTED Final   Shigella/Enteroinvasive E coli (EIEC) NOT DETECTED NOT DETECTED Final   Cryptosporidium NOT DETECTED NOT DETECTED Final   Cyclospora cayetanensis NOT DETECTED NOT DETECTED Final   Entamoeba histolytica NOT DETECTED NOT DETECTED Final   Giardia lamblia NOT DETECTED NOT DETECTED Final   Adenovirus F40/41 NOT DETECTED NOT DETECTED Final   Astrovirus NOT DETECTED NOT DETECTED Final   Norovirus GI/GII NOT DETECTED NOT DETECTED Final   Rotavirus A NOT DETECTED NOT DETECTED Final   Sapovirus (I, II, IV, and V) NOT DETECTED NOT DETECTED Final  C difficile quick scan w PCR reflex     Status: Abnormal   Collection Time: 07/06/16  7:04 PM  Result Value Ref Range Status   C Diff antigen POSITIVE (A) NEGATIVE Final   C Diff toxin POSITIVE (A) NEGATIVE Final   C Diff interpretation Toxin producing C. difficile detected.  Final    Comment: CRITICAL RESULT CALLED  TO, READ BACK BY AND VERIFIED WITH: Neill Loft RN 2044 07/06/16 A BROWNING   MRSA PCR Screening     Status: None   Collection Time: 07/07/16  4:20 AM  Result Value Ref Range Status   MRSA by PCR NEGATIVE NEGATIVE Final    Comment:        The GeneXpert MRSA Assay (FDA approved for NASAL specimens only), is one component of a comprehensive MRSA colonization surveillance program. It is not intended to diagnose MRSA infection nor to guide or monitor treatment for MRSA infections.          Radiology Studies: No results found.      Scheduled Meds: . donepezil  10 mg Oral QHS  . feeding supplement  1 Container Oral TID BM  . finasteride  5 mg Oral Daily  . fluticasone furoate-vilanterol  1 puff Inhalation Daily  . folic acid  1 mg Oral Daily  . metoprolol tartrate  50 mg Oral BID  . nicotine  14 mg Transdermal Daily  . nystatin   Topical BID  . predniSONE  5 mg Oral Q breakfast  . QUEtiapine  50 mg Oral QHS  . saccharomyces boulardii  250 mg Oral BID  . vancomycin  125 mg Oral QID   Continuous Infusions:     LOS: 3 days    Time spent: 25 minutes.    [facility], MD [facility] Pager [phone]  If 7PM-7AM, please contact night-coverage [email or web address] Password [facility] 07/09/2016, 3:44 PM

## 2016-07-09 NOTE — Clinical Social Work Placement (Signed)
   CLINICAL SOCIAL WORK PLACEMENT  NOTE  Date:  07/09/2016  Patient Details  Name: Paul Hansen MRN: [id number] Date of Birth: 07/26/1946  Clinical Social Work is seeking post-discharge placement for this patient at the Skilled  Nursing Facility level of care (*CSW will initial, date and re-position this form in  chart as items are completed):  Yes   Patient/family provided with [facility] Clinical Social Work Department's list of facilities offering this level of care within the geographic area requested by the patient (or if unable, by the patient's family).  Yes   Patient/family informed of their freedom to choose among providers that offer the needed level of care, that participate in Medicare, Medicaid or managed care program needed by the patient, have an available bed and are willing to accept the patient.  Yes   Patient/family informed of 's ownership interest in [facility] and [facility], as well as of the fact that they are under no obligation to receive care at these facilities.  PASRR submitted to EDS on       PASRR number received on       Existing PASRR number confirmed on 07/09/16     FL2 transmitted to all facilities in geographic area requested by pt/family on 07/09/16     FL2 transmitted to all facilities within larger geographic area on       Patient informed that his/her managed care company has contracts with or will negotiate with certain facilities, including the following:            Patient/family informed of bed offers received.  Patient chooses bed at       Physician recommends and patient chooses bed at      Patient to be transferred to   on  .  Patient to be transferred to facility by       Patient family notified on   of transfer.  Name of family member notified:        PHYSICIAN Please prepare priority discharge summary, including medications, Please prepare prescriptions, Please sign FL2     Additional Comment:     _______________________________________________ Terald Sleeper, LCSW 07/09/2016, 11:33 AM

## 2016-07-09 NOTE — Clinical Social Work Note (Signed)
Clinical Social Work Assessment  Patient Details  Name: Paul Hansen MRN: [id number] Date of Birth: 1946/02/13  Date of referral:  07/09/16               Reason for consult:  Discharge Planning                Permission sought to share information with:  Case Manager, Facility Medical sales representative, Family Supports Permission granted to share information::  No  Name::        Agency::     Relationship::     Contact Information:     Housing/Transportation Living arrangements for the past 2 months:  Single Family Home Source of Information:  Medical Team, Adult Children Patient Interpreter Needed:  None Criminal Activity/Legal Involvement Pertinent to Current Situation/Hospitalization:  No - Comment as needed Significant Relationships:  Adult Children Lives with:  Self Do you feel safe going back to the place where you live?  No Need for family participation in patient care:  Yes (Comment)  Care giving concerns:Pt confused and unable to participate in assessment. Clinical Social Worker spoke with pt daughter via phone, and daughter agreeable to SNF for higher level of care.    Social Worker assessment / plan:  Pt confused and not able to participate in assessment. Clinical social worker spoke with pt's daughter via phone and discussed CSW role with discharge planning. CSW also explained PT recommendation. Pt daughter agreeable to SNF for short-term rehab. Pt daughter also requested SNF in [place], or [place].   CSW will follow up with bed offers once available.   Employment status:  Retired Health and safety inspector:  Other (Comment Required) Engineer, materials) PT Recommendations:  Skilled Nursing Facility Information / Referral to community resources:  Skilled Nursing Facility  Patient/Family's Response to care: Pt confused. Pt's daughter appears to be happy with care pt is receiving at [facility].   Patient/Family's Understanding of and Emotional Response to Diagnosis,  Current Treatment, and Prognosis: Pt's daughter appears to have a good understanding of reason for pt admission into the hospital and with pt care plan.   Emotional Assessment Appearance:  Other (Comment Required (unable to assess) Attitude/Demeanor/Rapport:    Affect (typically observed):  Unable to Assess Orientation:  Oriented to Self Alcohol / Substance use:  Not Applicable Psych involvement (Current and /or in the community):  No (Comment)  Discharge Needs  Concerns to be addressed:  Discharge Planning Concerns Readmission within the last 30 days:  No Current discharge risk:  None Barriers to Discharge:  Continued Medical Work up   Terald Sleeper, LCSW 07/09/2016, 10:37 AM

## 2016-07-09 NOTE — NC FL2 (Signed)
[place] MEDICAID FL2 LEVEL OF CARE SCREENING TOOL     IDENTIFICATION  Patient Name: Paul Hansen Birthdate: Apr 08, 1946 Sex: male Admission Date (Current Location): 07/06/2016  [facility] and [place] Number:  Producer, television/film/video and Address:  [facility] [place], [address] [place], [place] [address]      Provider Number: [id number]  Attending Physician Name and Address:  Elease Etienne, MD  Relative Name and Phone Number:       Current Level of Care: Hospital Recommended Level of Care: Skilled Nursing Facility Prior Approval Number:    Date Approved/Denied:   PASRR Number:   [id number]   Discharge Plan: SNF    Current Diagnoses: Patient Active Problem List   Diagnosis Date Noted  . Diarrhea 07/06/2016  . Fall 07/06/2016  . Dementia 07/06/2016  . HTN (hypertension) 07/06/2016  . Bipolar disorder (HCC) 07/06/2016  . History of CVA (cerebrovascular accident) 07/06/2016  . RA (rheumatoid arthritis) (HCC) 07/06/2016  . AKI (acute kidney injury) (HCC) 07/06/2016  . Dehydration 07/06/2016  . Anemia 07/06/2016  . Hypokalemia 07/06/2016  . Decubitus ulcer of sacral region, stage 1 07/06/2016  . Tinea 07/06/2016    Orientation RESPIRATION BLADDER Height & Weight     Self  Normal Incontinent Weight: 115 lb 8 oz (52.4 kg) Height:  5\' 11"  (180.3 cm)  BEHAVIORAL SYMPTOMS/MOOD NEUROLOGICAL BOWEL NUTRITION STATUS   (None)  (history of stroke ) Continent Diet (Heart)  AMBULATORY STATUS COMMUNICATION OF NEEDS Skin   Extensive Assist   Surgical wounds                       Personal Care Assistance Level of Assistance  Bathing, Feeding, Dressing Bathing Assistance: Limited assistance Feeding assistance: Independent Dressing Assistance: Limited assistance     Functional Limitations Info  Sight, Hearing, Speech Sight Info: Adequate Hearing Info: Adequate Speech Info: Adequate    SPECIAL CARE FACTORS FREQUENCY  PT (By licensed PT)      PT Frequency:  (5x/week)              Contractures Contractures Info: Not present    Additional Factors Info  Code Status, Allergies Code Status Info:  (Full) Allergies Info:  (NKDA)           Current Medications (07/09/2016):  This is the current hospital active medication list Current Facility-Administered Medications  Medication Dose Route Frequency Provider Last Rate Last Dose  . acetaminophen (TYLENOL) tablet 650 mg  650 mg Oral Q6H PRN 09/08/2016, MD   650 mg at 07/07/16 0012   Or  . acetaminophen (TYLENOL) suppository 650 mg  650 mg Rectal Q6H PRN 09/06/16, MD      . donepezil (ARICEPT) tablet 10 mg  10 mg Oral QHS Michael Litter, MD   10 mg at 07/08/16 2157  . feeding supplement (BOOST / RESOURCE BREEZE) liquid 1 Container  1 Container Oral TID BM 2158, MD   1 Container at 07/09/16 0747  . finasteride (PROSCAR) tablet 5 mg  5 mg Oral Daily 09/08/16, MD   5 mg at 07/09/16 0747  . fluticasone furoate-vilanterol (BREO ELLIPTA) 100-25 MCG/INH 1 puff  1 puff Inhalation Daily 09/08/16, MD   1 puff at 07/09/16 1042  . folic acid (FOLVITE) tablet 1 mg  1 mg Oral Daily 09/08/16, MD   1 mg at 07/09/16 0747  . metoprolol (LOPRESSOR) tablet 50 mg  50 mg Oral  BID Michael Litter, MD   50 mg at 07/09/16 0748  . nicotine (NICODERM CQ - dosed in mg/24 hours) patch 14 mg  14 mg Transdermal Daily Michael Litter, MD   14 mg at 07/09/16 0924  . nystatin (MYCOSTATIN/NYSTOP) topical powder   Topical BID Michael Litter, MD      . ondansetron [facility]) tablet 4 mg  4 mg Oral Q6H PRN Michael Litter, MD       Or  . ondansetron [facility]) injection 4 mg  4 mg Intravenous Q6H PRN Michael Litter, MD      . predniSONE (DELTASONE) tablet 5 mg  5 mg Oral Q breakfast Michael Litter, MD   5 mg at 07/09/16 0747  . QUEtiapine (SEROQUEL) tablet 50 mg  50 mg Oral QHS Michael Litter, MD   50 mg at 07/08/16 2156  . saccharomyces boulardii (FLORASTOR) capsule 250 mg  250 mg Oral BID Leda Gauze, NP   250 mg at 07/09/16 0747  . vancomycin (VANCOCIN) 50 mg/mL oral solution 125 mg  125 mg Oral QID Leda Gauze, NP   125 mg at 07/09/16 1005     Discharge Medications: Please see discharge summary for a list of discharge medications.  Relevant Imaging Results:  Relevant Lab Results:   Additional Information SS#: [id number]  Terald Sleeper, LCSW

## 2016-07-10 NOTE — Progress Notes (Signed)
Physical Therapy Treatment Patient Details Name: Paul Hansen MRN: [id number] DOB: [birth year] Today's Date: 07/10/2016    History of Present Illness Pt is a 70 y/o male admitted following a fall at home which resulted in a laceration over his R eye requiring stitches. Pt denies LOC. PMH including but not limited to vascular dementia, bipolar disorder, HTN, RA and hx of CVA.    PT Comments    Pt able to increase his ambulation to 20 feet with rw and min assist. Pt remains at high fall risk and needs assistance with mobility for safety. Recommending SNF for further care following acute stay.   Follow Up Recommendations  SNF     Equipment Recommendations  None recommended by PT    Recommendations for Other Services       Precautions / Restrictions Precautions Precautions: Fall Restrictions Weight Bearing Restrictions: No    Mobility  Bed Mobility Overal bed mobility: Needs Assistance Bed Mobility: Supine to Sit     Supine to sit: HOB elevated;Min guard     General bed mobility comments: pt moving slowly but able to use bed rail to assist to sitting EOB.   Transfers Overall transfer level: Needs assistance Equipment used: Rolling walker (2 wheeled) Transfers: Sit to/from Stand Sit to Stand: Mod assist         General transfer comment: Pt having difficulty scooting forward to EOB, pt moves slowly but able to assist.   Ambulation/Gait Ambulation/Gait assistance: Min assist Ambulation Distance (Feet): 20 Feet Assistive device: Rolling walker (2 wheeled) Gait Pattern/deviations: Step-through pattern;Decreased step length - right;Decreased step length - left;Trunk flexed Gait velocity: decreased   General Gait Details: slow pattern, physical assist needed to maneuver rw safety.    Stairs            Wheelchair Mobility    Modified Rankin (Stroke Patients Only)       Balance Overall balance assessment: Needs assistance Sitting-balance support: Feet  supported Sitting balance-Leahy Scale: Fair     Standing balance support: Bilateral upper extremity supported Standing balance-Leahy Scale: Poor Standing balance comment: using rw and physical assist for balance                    Cognition Arousal/Alertness: Awake/alert Behavior During Therapy: WFL for tasks assessed/performed Overall Cognitive Status: History of cognitive impairments - at baseline                      Exercises      General Comments        Pertinent Vitals/Pain Pain Assessment: No/denies pain    Home Living                      Prior Function            PT Goals (current goals can now be found in the care plan section) Acute Rehab PT Goals Patient Stated Goal: none stated PT Goal Formulation: With patient Time For Goal Achievement: 07/15/16 Potential to Achieve Goals: Good Progress towards PT goals: Progressing toward goals    Frequency  Min 3X/week    PT Plan Current plan remains appropriate    Co-evaluation             End of Session Equipment Utilized During Treatment: Gait belt Activity Tolerance: Patient limited by fatigue Patient left: in chair;with call bell/phone within reach;with chair alarm set     Time: 6789-3810 PT Time Calculation (min) (ACUTE ONLY):  19 min  Charges:  $Gait Training: 8-22 mins                    G Codes:      Christiane Ha, PT, CSCS Pager [phone] Office [phone]  07/10/2016, 9:30 AM

## 2016-07-10 NOTE — Progress Notes (Signed)
Called Paul Hansen SW on her phone earlier today and left message, still waiting for call back, also notified Brittney SW if she can let Paul Hansen call me so I will know status of patient's discharge.

## 2016-07-10 NOTE — Progress Notes (Signed)
PROGRESS NOTE  Paul Hansen  YHC:[id number] DOB: 03/14/1946  DOA: 07/06/2016 PCP: Dan Maker, MD   Brief Narrative:  70 y.o. gentleman with a history of RA on methotrexate and prednisone, vascular dementia, bipolar disorder, prior CVA, and HTN who was referred to the ED after having a fall in his apartment (unwitnessed) that resulted in a laceration over his right eye.  He denies loss of consciousness. In the ED noted to have 3 episodes of diarrhea. Initially denied history of diarrhea at home but then stated that he's been having diarrhea for 2 days. Limited historian due to her history of dementia. Imaging including right forearm xray, right knee xray, and CT of the head and cervical spine do not show acute fractures.  His right eyebrow laceration has been sutured. Subsequently ruled in for C. difficile.   Assessment & Plan:   Active Problems:   Diarrhea   Fall   Dementia   HTN (hypertension)   Bipolar disorder (HCC)   History of CVA (cerebrovascular accident)   RA (rheumatoid arthritis) (HCC)   AKI (acute kidney injury) (HCC)   Dehydration   Anemia   Hypokalemia   Decubitus ulcer of sacral region, stage 1   Tinea   Fall with face laceration - Laceration over right eyebrow sutured in ED. Determined to be no longer safe to live independently and will need SNF. - PT and OT evaluation. Recommend SNF and as per social worker, DC 9/12.  C. difficile diarrhea - Continue oral vancomycin. Added probiotics.  - Diarrhea has improved. Complete 14 days oral vancomycin.  Severe hypokalemia - Replaced. Magnesium 1.7.  Acute kidney injury - Felt to be due to dehydration from GI losses and poor oral intake. Resolved after IV fluids.  Rheumatoid arthritis - Holding methotrexate. Continue prednisone.  History of dementia and bipolar disorder - Continuing home medications of Aricept, perphenazine and Seroquel. Patient does not have capacity to make medical decisions. As per RN report,  patient is ward of the state.  Right leg wound - Continue topical wound care.  Anemia and thrombocytopenia - Hemoglobin stable. Thrombocytopenia resolved.    DVT prophylaxis: SCDs Code Status: Full Family Communication: None at bedside. Discussed with patient's DSS social worker Ms. Jewel Monroe on 9/9. Updated care and answered questions. Disposition Plan: As per clinical social worker, DC to [facility] 8/12.   Consultants:   None  Procedures:   None  Antimicrobials:   Oral vancomycin    Subjective: Poor historian. Pleasantly confused. Patient denies pain or diarrhea. Single BM documented for 07/09/16  Objective:  Vitals:   07/09/16 1220 07/09/16 2144 07/10/16 0611 07/10/16 0934  BP: 114/70 129/79 120/68   Pulse: (!) 56 65 64   Resp: 16 17 16    Temp: 97.7 F (36.5 C) 98.4 F (36.9 C) 98.3 F (36.8 C)   TempSrc: Axillary Oral Oral   SpO2: 100% 100% 99% 100%  Weight:      Height:        Intake/Output Summary (Last 24 hours) at 07/10/16 1311 Last data filed at 07/10/16 0953  Gross per 24 hour  Intake              120 ml  Output              475 ml  Net             -355 ml   Filed Weights   07/06/16 2010  Weight: 52.4 kg (115 lb 8 oz)  Examination:  General exam: Pleasant elderly male, chronically ill looking, sitting up in chair this morning. HEENT: Laceration sutured over right eyebrow. Small superficial cut on mid nose. Respiratory system: Clear to auscultation. Respiratory effort normal. Cardiovascular system: S1 & S2 heard, RRR. No JVD, murmurs, rubs, gallops or clicks. No pedal edema. Gastrointestinal system: Abdomen is nondistended, soft and nontender. No organomegaly or masses felt. Normal bowel sounds heard. Central nervous system: Alert and oriented only to self. No focal neurological deficits. Extremities: Symmetric 5 x 5 power. Multiple superficial bruises in different stages on legs and the superficial wound on right upper leg. Skin: No  rashes, lesions or ulcers Psychiatry: Judgement and insight appear impaired. Mood & affect appropriate.     Data Reviewed: I have personally reviewed following labs and imaging studies  CBC:  Recent Labs Lab 07/06/16 1356 07/07/16 0339 07/08/16 0259  WBC 17.5* 10.7* 10.2  NEUTROABS 15.9*  --   --   HGB 12.2* 10.0* 10.3*  HCT 35.7* 30.4* 31.5*  MCV 88.1 89.1 90.3  PLT 197 143* 151   Basic Metabolic Panel:  Recent Labs Lab 07/06/16 1356 07/07/16 0339 07/07/16 0608 07/07/16 1946 07/08/16 0259 07/09/16 0529  NA 135 134*  --  139 140 135  K 3.1* 2.9*  --  3.8 3.8 4.0  CL 100* 105  --  110 114* 109  CO2 24 20*  --  23 21* 20*  GLUCOSE 103* 99  --  92 82 77  BUN 15 14  --  14 13 12   CREATININE 1.61* 1.56*  --  1.39* 1.22 1.19  CALCIUM 8.3* 7.4*  --  7.7* 7.6* 8.0*  MG  --   --  1.7  --   --   --    GFR: Estimated Creatinine Clearance: 42.2 mL/min (by C-G formula based on SCr of 1.19 mg/dL). Liver Function Tests:  Recent Labs Lab 07/07/16 0339  AST 23  ALT 10*  ALKPHOS 74  BILITOT 0.8  PROT 5.1*  ALBUMIN 2.0*   No results for input(s): LIPASE, AMYLASE in the last 168 hours. No results for input(s): AMMONIA in the last 168 hours. Coagulation Profile: No results for input(s): INR, PROTIME in the last 168 hours. Cardiac Enzymes:  Recent Labs Lab 07/06/16 1356  CKTOTAL 225   BNP (last 3 results) No results for input(s): PROBNP in the last 8760 hours. HbA1C: No results for input(s): HGBA1C in the last 72 hours. CBG: No results for input(s): GLUCAP in the last 168 hours. Lipid Profile: No results for input(s): CHOL, HDL, LDLCALC, TRIG, CHOLHDL, LDLDIRECT in the last 72 hours. Thyroid Function Tests: No results for input(s): TSH, T4TOTAL, FREET4, T3FREE, THYROIDAB in the last 72 hours. Anemia Panel: No results for input(s): VITAMINB12, FOLATE, FERRITIN, TIBC, IRON, RETICCTPCT in the last 72 hours.  Sepsis Labs:  Recent Labs Lab 07/06/16 1412    LATICACIDVEN 1.34    Recent Results (from the past 240 hour(s))  Urine culture     Status: Abnormal   Collection Time: 07/06/16  4:27 PM  Result Value Ref Range Status   Specimen Description URINE, RANDOM  Final   Special Requests NONE  Final   Culture <10,000 COLONIES/mL INSIGNIFICANT GROWTH (A)  Final   Report Status 07/07/2016 FINAL  Final  Gastrointestinal Panel by PCR , Stool     Status: None   Collection Time: 07/06/16  6:10 PM  Result Value Ref Range Status   Campylobacter species NOT DETECTED NOT DETECTED Final  Plesimonas shigelloides NOT DETECTED NOT DETECTED Final   Salmonella species NOT DETECTED NOT DETECTED Final   Yersinia enterocolitica NOT DETECTED NOT DETECTED Final   Vibrio species NOT DETECTED NOT DETECTED Final   Vibrio cholerae NOT DETECTED NOT DETECTED Final   Enteroaggregative E coli (EAEC) NOT DETECTED NOT DETECTED Final   Enteropathogenic E coli (EPEC) NOT DETECTED NOT DETECTED Final   Enterotoxigenic E coli (ETEC) NOT DETECTED NOT DETECTED Final   Shiga like toxin producing E coli (STEC) NOT DETECTED NOT DETECTED Final   Shigella/Enteroinvasive E coli (EIEC) NOT DETECTED NOT DETECTED Final   Cryptosporidium NOT DETECTED NOT DETECTED Final   Cyclospora cayetanensis NOT DETECTED NOT DETECTED Final   Entamoeba histolytica NOT DETECTED NOT DETECTED Final   Giardia lamblia NOT DETECTED NOT DETECTED Final   Adenovirus F40/41 NOT DETECTED NOT DETECTED Final   Astrovirus NOT DETECTED NOT DETECTED Final   Norovirus GI/GII NOT DETECTED NOT DETECTED Final   Rotavirus A NOT DETECTED NOT DETECTED Final   Sapovirus (I, II, IV, and V) NOT DETECTED NOT DETECTED Final  C difficile quick scan w PCR reflex     Status: Abnormal   Collection Time: 07/06/16  7:04 PM  Result Value Ref Range Status   C Diff antigen POSITIVE (A) NEGATIVE Final   C Diff toxin POSITIVE (A) NEGATIVE Final   C Diff interpretation Toxin producing C. difficile detected.  Final    Comment:  CRITICAL RESULT CALLED TO, READ BACK BY AND VERIFIED WITH: Neill Loft RN [id number] 07/06/16 A BROWNING   MRSA PCR Screening     Status: None   Collection Time: 07/07/16  4:20 AM  Result Value Ref Range Status   MRSA by PCR NEGATIVE NEGATIVE Final    Comment:        The GeneXpert MRSA Assay (FDA approved for NASAL specimens only), is one component of a comprehensive MRSA colonization surveillance program. It is not intended to diagnose MRSA infection nor to guide or monitor treatment for MRSA infections.          Radiology Studies: No results found.      Scheduled Meds: . donepezil  10 mg Oral QHS  . feeding supplement  1 Container Oral TID BM  . finasteride  5 mg Oral Daily  . fluticasone furoate-vilanterol  1 puff Inhalation Daily  . folic acid  1 mg Oral Daily  . metoprolol tartrate  50 mg Oral BID  . nicotine  14 mg Transdermal Daily  . nystatin   Topical BID  . predniSONE  5 mg Oral Q breakfast  . QUEtiapine  50 mg Oral QHS  . saccharomyces boulardii  250 mg Oral BID  . vancomycin  125 mg Oral QID   Continuous Infusions:     LOS: 4 days    Time spent: 25 minutes.    [facility], MD [facility] Pager [phone]  If 7PM-7AM, please contact night-coverage [email or web address] Password [email or web address] 07/10/2016, 1:11 PM

## 2016-07-11 DIAGNOSIS — M069 Rheumatoid arthritis, unspecified: Secondary | ICD-10-CM

## 2016-07-11 MED ORDER — NICOTINE 14 MG/24HR TD PT24: 14.0000 mg | MEDICATED_PATCH | Freq: Every day | TRANSDERMAL | Status: AC

## 2016-07-11 MED ORDER — COLLAGENASE 250 UNIT/GM EX OINT: TOPICAL_OINTMENT | Freq: Every day | CUTANEOUS | Status: AC

## 2016-07-11 MED ORDER — QUETIAPINE FUMARATE 50 MG PO TABS: 50.0000 mg | ORAL_TABLET | Freq: Every day | ORAL | Status: AC

## 2016-07-11 MED ORDER — QUETIAPINE FUMARATE 50 MG PO TABS: 25.0000 mg | ORAL_TABLET | Freq: Every day | ORAL | Status: DC

## 2016-07-11 MED ORDER — COLLAGENASE 250 UNIT/GM EX OINT
TOPICAL_OINTMENT | Freq: Every day | CUTANEOUS | Status: DC
Start: 2016-07-11 — End: 2016-07-12
  Administered 2016-07-11 – 2016-07-12 (×2): via TOPICAL
  Filled 2016-07-11: qty 30

## 2016-07-11 MED ORDER — BOOST / RESOURCE BREEZE PO LIQD: 1.0000 | Freq: Three times a day (TID) | ORAL | Status: AC

## 2016-07-11 MED ORDER — VANCOMYCIN 50 MG/ML ORAL SOLUTION: 125.0000 mg | Freq: Four times a day (QID) | ORAL | Status: AC

## 2016-07-11 MED ORDER — NYSTATIN 100000 UNIT/GM EX POWD: Freq: Two times a day (BID) | CUTANEOUS | Status: AC

## 2016-07-11 MED ORDER — SACCHAROMYCES BOULARDII 250 MG PO CAPS: 250.0000 mg | ORAL_CAPSULE | Freq: Two times a day (BID) | ORAL | Status: AC

## 2016-07-11 NOTE — Consult Note (Signed)
WOC Nurse wound consult note Reason for Consult: Consult requested for bilat legs.  Pt fell at home prior to admission. Wound type: Left outer leg with partial thickness wound; .5X1X.1cm, pink and moist, scant amt pink drainage, no odor  Right outer leg with full thickness wound; 2.5X5cm, 90% yellow slough, 10% red, small amt yellow drainage, no odor  Dressing procedure/placement/frequency: Apply Santyl to right outer leg wound Q day, then cover with moist fluffed gauze and foam dressing.  (Change foam dressing Q 5 days or PRN soiling.) Foam dressing to left outer leg, change Q 5 days or PRN soiling.  Please re-consult if further assistance is needed.  Thank-you,  Cammie Mcgee MSN, RN, CWOCN, [place], CNS [phone]

## 2016-07-11 NOTE — Discharge Summary (Addendum)
Physician Discharge Summary  EMMITTE SPINDEL VKP:[id number] DOB: 1946-05-03  PCP: Dan Maker, MD  Admit date: 07/06/2016 Discharge date: 07/11/2016  Admitted From: Home Disposition:  SNF  Recommendations for Outpatient Follow-up:  1. MD at SNF in 3 days with repeat labs (CBC, BMP & EKG for QTC) 2. Dr. Royanne Foots, PCP upon discharge from SNF 3. Methotrexate has been temporarily held during the course of this hospitalization due to acute infection. May consider resuming it in a week after discharge. Patient apparently lived alone and is confused and it is not entirely clear as to which of his medications he was supposed to be on and what he was taking. Pharmacy here reconciled his medications after discussing with his outpatient pharmacy. Please verify with his PCP regarding his medications. 4. Sutures of right sided forehead laceration to be removed in 5 days.   Home Health: None Equipment/Devices: None    Discharge Condition: Improved and stable.  CODE STATUS: Full  Diet recommendation: Heart Heathy diet.  Wound care instructions: As per wound care team:  Wound type: Left outer leg with partial thickness wound; .5X1X.1cm, pink and moist, scant amt pink drainage, no odor  Right outer leg with full thickness wound; 2.5X5cm, 90% yellow slough, 10% red, small amt yellow drainage, no odor  Dressing procedure/placement/frequency: Apply Santyl to right outer leg wound Q day, then cover with moist fluffed gauze and foam dressing.  (Change foam dressing Q 5 days or PRN soiling.) Foam dressing to left outer leg, change Q 5 days or PRN soiling.  Discharge Diagnoses:  Active Problems:   Diarrhea   Fall   Dementia   HTN (hypertension)   Bipolar disorder (HCC)   History of CVA (cerebrovascular accident)   RA (rheumatoid arthritis) (HCC)   AKI (acute kidney injury) (HCC)   Dehydration   Anemia   Hypokalemia   Decubitus ulcer of sacral region, stage 1   Tinea   Brief/Interim  Summary: 70 y.o.gentleman with a history of RA on methotrexate and prednisone, vascular dementia, bipolar disorder, prior CVA, and HTN who was referred to the ED after having a fall in his apartment (unwitnessed) that resulted in a laceration over his right eye. He denies loss of consciousness. In the ED noted to have 3 episodes of diarrhea. Initially denied history of diarrhea at home but then stated that he's been having diarrhea for 2 days. Limited historian due to her history of dementia. Imaging including right forearm xray, right knee xray, and CT of the head and cervical spine do not show acute fractures. His right eyebrow laceration has been sutured. Subsequently ruled in for C. difficile.   Assessment & Plan:   Fall with face laceration - Laceration over right eyebrow sutured in ED. Determined to be no longer safe to live independently and will need SNF. - PT and OT evaluation. Recommend SNF. - Sutures to be removed in 5 days.  C. difficile diarrhea - Continue oral vancomycin. Added probiotics.  - Diarrhea resolved. Complete total 14 days of oral vancomycin.  Severe hypokalemia - Replaced. Magnesium 1.7.  Acute kidney injury - Felt to be due to dehydration from GI losses and poor oral intake. Resolved after IV fluids.  Rheumatoid arthritis - Holding methotrexate. Continue prednisone. Patient's medications need to be verified with his PCP and if on methotrexate prior to admission, may be resumed in a week's time.  History of dementia and bipolar disorder - Continuing home medications of Aricept and Seroquel (changed to 50 mg QHS,  QTC 444 msec). Recheck EKG in a couple of days to monitor QTC. Patient does not have capacity to make medical decisions. As per RN report, patient is ward of the state. No agitation. Cooperative with care.  Right leg wound - Continue topical wound care as per wound care recommendations made today.  Anemia and thrombocytopenia - Hemoglobin  stable. Thrombocytopenia resolved. Periodically follow CBCs as outpatient.    Consultants:   None  Procedures:   None    Discharge Instructions  Discharge Instructions    Call MD for:  difficulty breathing, headache or visual disturbances    Complete by:  As directed   Call MD for:  extreme fatigue    Complete by:  As directed   Call MD for:  persistant dizziness or light-headedness    Complete by:  As directed   Call MD for:  persistant nausea and vomiting    Complete by:  As directed   Call MD for:  redness, tenderness, or signs of infection (pain, swelling, redness, odor or green/yellow discharge around incision site)    Complete by:  As directed   Call MD for:  severe uncontrolled pain    Complete by:  As directed   Call MD for:  temperature >100.4    Complete by:  As directed   Diet - low sodium heart healthy    Complete by:  As directed   Discharge wound care:    Complete by:  As directed   As per Wound Care team:  Wound type: Left outer leg with partial thickness wound; .5X1X.1cm, pink and moist, scant amt pink drainage, no odor  Right outer leg with full thickness wound; 2.5X5cm, 90% yellow slough, 10% red, small amt yellow drainage, no odor  Dressing procedure/placement/frequency: Apply Santyl to right outer leg wound Q day, then cover with moist fluffed gauze and foam dressing.  (Change foam dressing Q 5 days or PRN soiling.) Foam dressing to left outer leg, change Q 5 days or PRN soiling.   Increase activity slowly    Complete by:  As directed       Medication List    STOP taking these medications   methotrexate 2.5 MG tablet Commonly known as:  RHEUMATREX     TAKE these medications   BREO ELLIPTA 100-25 MCG/INH Aepb Generic drug:  fluticasone furoate-vilanterol Inhale 1 puff into the lungs daily.   collagenase ointment Commonly known as:  SANTYL Apply topically daily. Apply Santyl to right outer leg wound Q day, then cover with moist fluffed gauze  and foam dressing.  (Change foam dressing Q 5 days or PRN soiling.)   donepezil 10 MG tablet Commonly known as:  ARICEPT Take 10 mg by mouth daily.   feeding supplement Liqd Take 1 Container by mouth 3 (three) times daily between meals.   finasteride 5 MG tablet Commonly known as:  PROSCAR Take 5 mg by mouth daily.   folic acid 1 MG tablet Commonly known as:  FOLVITE Take 1 mg by mouth daily.   metoprolol 50 MG tablet Commonly known as:  LOPRESSOR Take 50 mg by mouth 2 (two) times daily.   nicotine 14 mg/24hr patch Commonly known as:  NICODERM CQ - dosed in mg/24 hours Place 1 patch (14 mg total) onto the skin daily.   nystatin powder Commonly known as:  MYCOSTATIN/NYSTOP Apply topically 2 (two) times daily. Apply to inguinal folds   predniSONE 5 MG tablet Commonly known as:  DELTASONE Take 5 mg by mouth  daily.   QUEtiapine 50 MG tablet Commonly known as:  SEROQUEL Take 1 tablet (50 mg total) by mouth at bedtime. What changed:  when to take this   saccharomyces boulardii 250 MG capsule Commonly known as:  FLORASTOR Take 1 capsule (250 mg total) by mouth 2 (two) times daily.   vancomycin 50 mg/mL oral solution Commonly known as:  VANCOCIN Take 2.5 mLs (125 mg total) by mouth 4 (four) times daily. Discontinue after 07/20/16 doses.      Follow-up Information    MD at SNF. Schedule an appointment as soon as possible for a visit in 3 day(s).   Why:  To be seen with repeat labs (CBC, BMP & EKG for QTC).       Dan Maker, MD. Schedule an appointment as soon as possible for a visit today.   Specialty:  Internal Medicine Why:  Upon discharge from SNF. Contact information: [address] [place] [address] [phone]          No Known Allergies   Procedures/Studies: Dg Forearm Right  Result Date: 07/06/2016 CLINICAL DATA:  Larey Seat today.  Right forearm pain. EXAM: RIGHT FOREARM - 2 VIEW COMPARISON:  None. FINDINGS: The wrist and elbow  joints are maintained. No acute forearm fracture. IMPRESSION: No acute fracture. Electronically Signed   By: Rudie Meyer M.D.   On: 07/06/2016 13:19   Ct Head Wo Contrast  Result Date: 07/06/2016 CLINICAL DATA:  Fall last night. Laceration above the right eye. Initial encounter. EXAM: CT HEAD WITHOUT CONTRAST CT CERVICAL SPINE WITHOUT CONTRAST TECHNIQUE: Multidetector CT imaging of the head and cervical spine was performed following the standard protocol without intravenous contrast. Multiplanar CT image reconstructions of the cervical spine were also generated. COMPARISON:  Brain MRI 12/03/2009. FINDINGS: CT HEAD FINDINGS Brain: There is moderate cerebral atrophy with greatest involvement of the parietal lobes. 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 There is no evidence of acute cortical infarct, intracranial hemorrhage, mass, midline shift, or extra-axial fluid collection. Patchy to confluent cerebral white matter hypodensities are nonspecific but compatible with moderate chronic small vessel ischemic disease, advanced for age. Vascular: Calcified atherosclerosis at the skullbase. Skull: No fracture identified. Sinuses/Orbits: Visualized paranasal sinuses and mastoid air cells are clear. Orbits are unremarkable. Other: Mild supraorbital soft tissue swelling. CT CERVICAL SPINE FINDINGS Alignment: No evidence of traumatic subluxation. Skull base and vertebrae: No fracture or destructive osseous lesion. Soft tissues and spinal canal: No prevertebral edema. Mild right-sided cervical atherosclerosis. Disc levels:  Mild cervical spondylosis, most notable at C6-7. Upper chest: Advanced centrilobular emphysema. Right greater than left apical lung scarring. Other: None. IMPRESSION: 1. No evidence of acute intracranial abnormality. 2. Mild right supraorbital soft tissue swelling. 3. Moderately advanced chronic small vessel ischemic disease. 4. No acute abnormality identified in the  cervical spine. Electronically Signed   By: Sebastian Ache M.D.   On: 07/06/2016 13:51   Ct Cervical Spine Wo Contrast  Result Date: 07/06/2016 CLINICAL DATA:  Fall last night. Laceration above the right eye. Initial encounter. EXAM: CT HEAD WITHOUT CONTRAST CT CERVICAL SPINE WITHOUT CONTRAST TECHNIQUE: Multidetector CT imaging of the head and cervical spine was performed following the standard protocol without intravenous contrast. Multiplanar CT image reconstructions of the cervical spine were also generated. COMPARISON:  Brain MRI 12/03/2009. FINDINGS: CT HEAD FINDINGS Brain: There is moderate cerebral atrophy with greatest involvement of the parietal lobes. Chronic infarcts are present in the cerebellum and left basal  ganglia. There is no evidence of acute cortical infarct, intracranial hemorrhage, mass, midline shift, or extra-axial fluid collection. Patchy to confluent cerebral white matter hypodensities are nonspecific but compatible with moderate chronic small vessel ischemic disease, advanced for age. Vascular: Calcified atherosclerosis at the skullbase. Skull: No fracture identified. Sinuses/Orbits: Visualized paranasal sinuses and mastoid air cells are clear. Orbits are unremarkable. Other: Mild supraorbital soft tissue swelling. CT CERVICAL SPINE FINDINGS Alignment: No evidence of traumatic subluxation. Skull base and vertebrae: No fracture or destructive osseous lesion. Soft tissues and spinal canal: No prevertebral edema. Mild right-sided cervical atherosclerosis. Disc levels:  Mild cervical spondylosis, most notable at C6-7. Upper chest: Advanced centrilobular emphysema. Right greater than left apical lung scarring. Other: None. IMPRESSION: 1. No evidence of acute intracranial abnormality. 2. Mild right supraorbital soft tissue swelling. 3. Moderately advanced chronic small vessel ischemic disease. 4. No acute abnormality identified in the cervical spine. Electronically Signed   By: Sebastian Ache M.D.    On: 07/06/2016 13:51   Dg Chest Port 1 View  Result Date: 07/07/2016 CLINICAL DATA:  Recent fall EXAM: PORTABLE CHEST 1 VIEW COMPARISON:  10/12/2015 FINDINGS: Cardiac shadow is within normal limits. The lungs are again hyperinflated. Some chronic scarring is noted in the bases bilaterally. No focal infiltrate or sizable effusion is seen. No acute bony abnormality is noted. IMPRESSION: No acute abnormality seen. Electronically Signed   By: Alcide Clever M.D.   On: 07/07/2016 08:52   Dg Knee Complete 4 Views Right  Result Date: 07/06/2016 CLINICAL DATA:  Tripped over own feet and fell, weakness, anterior RIGHT laceration EXAM: RIGHT KNEE - COMPLETE 4+ VIEW COMPARISON:  Non FINDINGS: Osseous demineralization. Joint space narrowing. No acute fracture, dislocation, or bone destruction. No knee joint effusion. Scattered vascular calcifications. IMPRESSION: Osseous demineralization with mild degenerative changes. No acute bony abnormalities identified. Electronically Signed   By: Ulyses Southward M.D.   On: 07/06/2016 13:21      Subjective: Pleasantly confused. Denies complaints. As per RN, no acute issues and diarrhea seems to have resolved.  Discharge Exam:  Vitals:   07/10/16 1431 07/10/16 2059 07/11/16 0538 07/11/16 1034  BP: 114/68 132/72 127/79   Pulse: (!) 48 61 60   Resp: 20 19 16    Temp: 98.3 F (36.8 C) 97.5 F (36.4 C) 97 F (36.1 C)   TempSrc: Oral Oral Oral   SpO2: 100% 100% 97% 98%  Weight:      Height:        General exam: Pleasant elderly male, chronically ill looking, lying comfortably in bed. HEENT: Laceration sutured over right eyebrow & no acute findings. Small superficial cut on mid nose- healing. Respiratory system: Clear to auscultation. Respiratory effort normal. Cardiovascular system: S1 & S2 heard, RRR. No JVD, murmurs, rubs, gallops or clicks. No pedal edema. Gastrointestinal system: Abdomen is nondistended, soft and nontender. No organomegaly or masses felt. Normal  bowel sounds heard. Central nervous system: Alert and oriented only to self. No focal neurological deficits. Extremities: Symmetric 5 x 5 power. Multiple superficial bruises in different stages on legs and the superficial wound on right upper leg. Evaluation of leg wounds per WOC RN. Skin: No rashes, lesions or ulcers Psychiatry: Judgement and insight appear impaired. Mood & affect appropriate.     The results of significant diagnostics from this hospitalization (including imaging, microbiology, ancillary and laboratory) are listed below for reference.     Microbiology: Recent Results (from the past 240 hour(s))  Urine culture  Status: Abnormal   Collection Time: 07/06/16  4:27 PM  Result Value Ref Range Status   Specimen Description URINE, RANDOM  Final   Special Requests NONE  Final   Culture <10,000 COLONIES/mL INSIGNIFICANT GROWTH (A)  Final   Report Status 07/07/2016 FINAL  Final  Gastrointestinal Panel by PCR , Stool     Status: None   Collection Time: 07/06/16  6:10 PM  Result Value Ref Range Status   Campylobacter species NOT DETECTED NOT DETECTED Final   Plesimonas shigelloides NOT DETECTED NOT DETECTED Final   Salmonella species NOT DETECTED NOT DETECTED Final   Yersinia enterocolitica NOT DETECTED NOT DETECTED Final   Vibrio species NOT DETECTED NOT DETECTED Final   Vibrio cholerae NOT DETECTED NOT DETECTED Final   Enteroaggregative E coli (EAEC) NOT DETECTED NOT DETECTED Final   Enteropathogenic E coli (EPEC) NOT DETECTED NOT DETECTED Final   Enterotoxigenic E coli (ETEC) NOT DETECTED NOT DETECTED Final   Shiga like toxin producing E coli (STEC) NOT DETECTED NOT DETECTED Final   Shigella/Enteroinvasive E coli (EIEC) NOT DETECTED NOT DETECTED Final   Cryptosporidium NOT DETECTED NOT DETECTED Final   Cyclospora cayetanensis NOT DETECTED NOT DETECTED Final   Entamoeba histolytica NOT DETECTED NOT DETECTED Final   Giardia lamblia NOT DETECTED NOT DETECTED Final    Adenovirus F40/41 NOT DETECTED NOT DETECTED Final   Astrovirus NOT DETECTED NOT DETECTED Final   Norovirus GI/GII NOT DETECTED NOT DETECTED Final   Rotavirus A NOT DETECTED NOT DETECTED Final   Sapovirus (I, II, IV, and V) NOT DETECTED NOT DETECTED Final  C difficile quick scan w PCR reflex     Status: Abnormal   Collection Time: 07/06/16  7:04 PM  Result Value Ref Range Status   C Diff antigen POSITIVE (A) NEGATIVE Final   C Diff toxin POSITIVE (A) NEGATIVE Final   C Diff interpretation Toxin producing C. difficile detected.  Final    Comment: CRITICAL RESULT CALLED TO, READ BACK BY AND VERIFIED WITH: Neill Loft RN 2044 07/06/16 A BROWNING   MRSA PCR Screening     Status: None   Collection Time: 07/07/16  4:20 AM  Result Value Ref Range Status   MRSA by PCR NEGATIVE NEGATIVE Final    Comment:        The GeneXpert MRSA Assay (FDA approved for NASAL specimens only), is one component of a comprehensive MRSA colonization surveillance program. It is not intended to diagnose MRSA infection nor to guide or monitor treatment for MRSA infections.      Labs: BNP (last 3 results) No results for input(s): BNP in the last 8760 hours. Basic Metabolic Panel:  Recent Labs Lab 07/06/16 1356 07/07/16 0339 07/07/16 0608 07/07/16 1946 07/08/16 0259 07/09/16 0529  NA 135 134*  --  139 140 135  K 3.1* 2.9*  --  3.8 3.8 4.0  CL 100* 105  --  110 114* 109  CO2 24 20*  --  23 21* 20*  GLUCOSE 103* 99  --  92 82 77  BUN 15 14  --  14 13 12   CREATININE 1.61* 1.56*  --  1.39* 1.22 1.19  CALCIUM 8.3* 7.4*  --  7.7* 7.6* 8.0*  MG  --   --  1.7  --   --   --    Liver Function Tests:  Recent Labs Lab 07/07/16 0339  AST 23  ALT 10*  ALKPHOS 74  BILITOT 0.8  PROT 5.1*  ALBUMIN 2.0*  No results for input(s): LIPASE, AMYLASE in the last 168 hours. No results for input(s): AMMONIA in the last 168 hours. CBC:  Recent Labs Lab 07/06/16 1356 07/07/16 0339 07/08/16 0259  WBC 17.5*  10.7* 10.2  NEUTROABS 15.9*  --   --   HGB 12.2* 10.0* 10.3*  HCT 35.7* 30.4* 31.5*  MCV 88.1 89.1 90.3  PLT 197 143* 151   Cardiac Enzymes:  Recent Labs Lab 07/06/16 1356  CKTOTAL 225   Urinalysis    Component Value Date/Time   COLORURINE AMBER (A) 07/06/2016 1627   APPEARANCEUR CLEAR 07/06/2016 1627   LABSPEC 1.021 07/06/2016 1627   PHURINE 6.0 07/06/2016 1627   GLUCOSEU NEGATIVE 07/06/2016 1627   HGBUR TRACE (A) 07/06/2016 1627   BILIRUBINUR MODERATE (A) 07/06/2016 1627   KETONESUR 15 (A) 07/06/2016 1627   PROTEINUR 100 (A) 07/06/2016 1627   NITRITE NEGATIVE 07/06/2016 1627   LEUKOCYTESUR TRACE (A) 07/06/2016 1627    Time coordinating discharge: Over 30 minutes  SIGNED:  Marcellus Scott, MD, FACP, FHM. [facility] Pager [phone]  If 7PM-7AM, please contact night-coverage [email or web address] Password [email or web address] 07/11/2016, 2:12 PM

## 2016-07-11 NOTE — Discharge Instructions (Signed)
Clostridium Difficile Infection Clostridium difficile (C. difficile or C. diff) is a bacterium normally found in the intestinal tract or colon. C. difficile infection causes diarrhea and sometimes a severe disease called pseudomembranous colitis (C. difficile colitis). C. difficile colitis can damage the lining of the colon or cause the colon to become very large (toxic megacolon). Older adults and people with certain medical conditions have a greater risk of getting C. difficile infections. CAUSES The balance of bacteria in your colon can change when you are sick, especially when taking antibiotic medicine. Taking antibiotics may allow the C. difficile to grow, multiply, and make a toxin that causes C. difficile infection.  SYMPTOMS  Diarrhea.  Fever.  Fatigue.  Loss of appetite.  Nausea.  Abdominal swelling, pain, or tenderness.  Dehydration. DIAGNOSIS Your health care provider may suspect C. difficile infection based on your symptoms and if you have taken antibiotics recently. Your health care provider may also order:  A lab test that can detect the toxin in your stool.  A sigmoidoscopy or colonoscopy to look at the appearance of your colon. These procedures involve passing an instrument through your rectum to look at the inside of your colon. Your health care provider will help determine if these tests are necessary. TREATMENT Treatment may include:  Taking antibiotics that keep C. difficile from growing.  Stopping the antibiotics you were on before the C. difficile infection began. Only do this if instructed to do so by your health care provider.  IV fluids and correction of electrolyte imbalance.  Surgery to remove the infected part of the intestines. This is rare. HOME CARE INSTRUCTIONS  Drink enough fluids to keep your urine clear or pale yellow. Avoid milk, caffeine, and alcohol.  Ask your health care provider for specific rehydration instructions.  Eat small,  frequent meals rather than large meals.  Take your antibiotics as directed. Finish them even if you start to feel better.  Do not use medicines to slow diarrhea. This could delay healing or cause problems.  Wash your hands thoroughly after using the bathroom and before preparing food. Make sure people who live with you wash their hands often, too.  Clean all surfaces with a product that contains chlorine bleach. SEEK MEDICAL CARE IF:  Your diarrhea lasts longer than expected or comes back after you finish your antibiotic medicine for the C. difficile infection.  You have trouble staying hydrated.  You have a fever. SEEK IMMEDIATE MEDICAL CARE IF:  You have increasing abdominal pain or tenderness.  You have blood in your stools, or your stools look dark black and tarry.  You cannot eat or drink without vomiting.   This information is not intended to replace advice given to you by your health care provider. Make sure you discuss any questions you have with your health care provider.   Document Released: 07/26/2005 Document Revised: 11/06/2014 Document Reviewed: 04/19/2015 Elsevier Interactive Patient Education 2016 [employer]  

## 2016-07-11 NOTE — Progress Notes (Signed)
Pt ready for d/c to SNF, noted.  Unit CSW to f/u in am and facilitate NH tx.  Pollyann Savoy, LCSW [phone]

## 2016-07-12 DIAGNOSIS — Z8673 Personal history of transient ischemic attack (TIA), and cerebral infarction without residual deficits: Secondary | ICD-10-CM

## 2016-07-12 DIAGNOSIS — R197 Diarrhea, unspecified: Secondary | ICD-10-CM

## 2016-07-12 DIAGNOSIS — N179 Acute kidney failure, unspecified: Secondary | ICD-10-CM

## 2016-07-12 DIAGNOSIS — F0390 Unspecified dementia without behavioral disturbance: Secondary | ICD-10-CM

## 2016-07-12 DIAGNOSIS — E860 Dehydration: Secondary | ICD-10-CM

## 2016-07-12 DIAGNOSIS — L89151 Pressure ulcer of sacral region, stage 1: Secondary | ICD-10-CM

## 2016-07-12 NOTE — Clinical Social Work Note (Signed)
Patient will discharge today per MD order. Patient will discharge to: [facility] RN to call report prior to transportation to: [phone] Transportation: [facility]- to be scheduled once insurance authorization has been received.  CSW sent discharge summary to SNF for review.  RN and patient's guardian Jewel have both been updated regarding transportation and dc plans.  Guardian cell: [phone] Vickii Penna, MSW, LCSW  [phone]  Licensed Clinical Social Worker

## 2016-07-12 NOTE — Clinical Social Work Placement (Signed)
   CLINICAL SOCIAL WORK PLACEMENT  NOTE  Date:  07/12/2016  Patient Details  Name: Paul Hansen MRN: [id number] Date of Birth: Mar 29, 1946  Clinical Social Work is seeking post-discharge placement for this patient at the Skilled  Nursing Facility level of care (*CSW will initial, date and re-position this form in  chart as items are completed):  Yes   Patient/family provided with [facility] Department's list of facilities offering this level of care within the geographic area requested by the patient (or if unable, by the patient's family).  Yes   Patient/family informed of their freedom to choose among providers that offer the needed level of care, that participate in Medicare, Medicaid or managed care program needed by the patient, have an available bed and are willing to accept the patient.  Yes   Patient/family informed of [facility]'s ownership interest in [facility] and [facility], as well as of the fact that they are under no obligation to receive care at these facilities.  PASRR submitted to EDS on       PASRR number received on       Existing PASRR number confirmed on 07/09/16     FL2 transmitted to all facilities in geographic area requested by pt/family on 07/09/16     FL2 transmitted to all facilities within larger geographic area on       Patient informed that his/her managed care company has contracts with or will negotiate with certain facilities, including the following:        Yes   Patient/family informed of bed offers received.  Patient chooses bed at [facility]     Physician recommends and patient chooses bed at      Patient to be transferred to [facility] on 07/12/16.  Patient to be transferred to facility by [facility]     Patient family notified on 07/12/16 of transfer.  Name of family member notified:  Ronnald Collum [phone]     PHYSICIAN Please prepare priority discharge summary, including  medications, Please prepare prescriptions, Please sign FL2     Additional Comment:    _______________________________________________ Rondel Baton, LCSW 07/12/2016, 1:08 PM

## 2016-07-12 NOTE — Progress Notes (Signed)
1600 Report given to Bree at [facility] center. Pt is alert, forgetful.  1915 Discharged to [facility] via [place].

## 2016-07-12 NOTE — Discharge Summary (Signed)
Physician Discharge Summary  Paul BeckwithJohn D Hansen WUJ:[id number] DOB: 08-30-1946  PCP: Paul MakerR,RICHARD L, MD  Admit date: 07/06/2016 Discharge date: 07/12/2016  Admitted From: Home Disposition:  SNF  Recommendations for Outpatient Follow-up:  1. MD at SNF in 3 days with repeat labs (CBC, BMP & EKG for QTC) 2. Dr. Royanne Footsichard Orr, PCP upon discharge from SNF 3. Methotrexate has been temporarily held during the course of this hospitalization due to acute infection. May consider resuming it in a week after discharge. Patient apparently lived alone and is confused and it is not entirely clear as to which of his medications he was supposed to be on and what he was taking. Pharmacy here reconciled his medications after discussing with his outpatient pharmacy. Please verify with his PCP regarding his medications. 4. Sutures of right sided forehead laceration to be removed in 5 days.   Home Health: None  Equipment/Devices: None    Discharge Condition: Improved and stable.   CODE STATUS: Full   Diet recommendation: Heart Heathy diet.  Wound care instructions: As per wound care team:  Wound type: Left outer leg with partial thickness wound; .5X1X.1cm, pink and moist, scant amt pink drainage, no odor  Right outer leg with full thickness wound; 2.5X5cm, 90% yellow slough, 10% red, small amt yellow drainage, no odor  Dressing procedure/placement/frequency: Apply Santyl to right outer leg wound Q day, then cover with moist fluffed gauze and foam dressing.  (Change foam dressing Q 5 days or PRN soiling.) Foam dressing to left outer leg, change Q 5 days or PRN soiling.  Discharge Diagnoses:     Diarrhea: C. difficile colitis   Fall   Dementia   HTN (hypertension)   Bipolar disorder (HCC)   History of CVA (cerebrovascular accident)   RA (rheumatoid arthritis) (HCC)   AKI (acute kidney injury) (HCC)   Dehydration   Anemia   Hypokalemia   Decubitus ulcer of sacral region, stage 1    Tinea   Brief/Interim Summary: 70 y.o.gentleman with a history of RA on methotrexate and prednisone, vascular dementia, bipolar disorder, prior CVA, and HTN who was referred to the ED after having a fall in his apartment (unwitnessed) that resulted in a laceration over his right eye. He denies loss of consciousness. In the ED noted to have 3 episodes of diarrhea. Initially denied history of diarrhea at home but then stated that he's been having diarrhea for 2 days. Limited historian due to her history of dementia. Imaging including right forearm xray, right knee xray, and CT of the head and cervical spine do not show acute fractures. His right eyebrow laceration has been sutured. Subsequently ruled in for C. difficile.   Assessment & Plan:   Fall with face laceration - Laceration over right eyebrow was sutured in ED. Determined to be no longer safe to live independently and will need SNF. - PT and OT evaluation was done and recommended SNF. - Sutures to be removed in 5 days.  C. difficile diarrhea - Continue oral vancomycin. Added probiotics.  - Diarrhea resolved. Complete total 14 days of oral vancomycin.  Severe hypokalemia - Replaced. Magnesium 1.7.  Acute kidney injury - Felt to be due to dehydration from GI losses and poor oral intake. Resolved after IV fluids.  Rheumatoid arthritis - Holding methotrexate. Continue prednisone. Patient's medications need to be verified with his PCP and if on methotrexate prior to admission, may be resumed in a week's time.  History of dementia and bipolar disorder - Continuing home medications  of Aricept and Seroquel (changed to 50 mg QHS, QTC 444 msec). Recheck EKG in a couple of days to monitor QTC. Patient does not have capacity to make medical decisions. As per RN report, patient is ward of the state. No agitation. Cooperative with care.  Right leg wound - Continue topical wound care as per wound care recommendations made  today.  Anemia and thrombocytopenia - Hemoglobin stable. Thrombocytopenia resolved. Periodically follow CBCs as outpatient.    Consultants:   None  Procedures:   None    Discharge Instructions  Discharge Instructions    Call MD for:  difficulty breathing, headache or visual disturbances    Complete by:  As directed    Call MD for:  extreme fatigue    Complete by:  As directed    Call MD for:  persistant dizziness or light-headedness    Complete by:  As directed    Call MD for:  persistant nausea and vomiting    Complete by:  As directed    Call MD for:  redness, tenderness, or signs of infection (pain, swelling, redness, odor or green/yellow discharge around incision site)    Complete by:  As directed    Call MD for:  severe uncontrolled pain    Complete by:  As directed    Call MD for:  temperature >100.4    Complete by:  As directed    Diet - low sodium heart healthy    Complete by:  As directed    Discharge wound care:    Complete by:  As directed    As per Wound Care team:  Wound type: Left outer leg with partial thickness wound; .5X1X.1cm, pink and moist, scant amt pink drainage, no odor  Right outer leg with full thickness wound; 2.5X5cm, 90% yellow slough, 10% red, small amt yellow drainage, no odor  Dressing procedure/placement/frequency: Apply Santyl to right outer leg wound Q day, then cover with moist fluffed gauze and foam dressing.  (Change foam dressing Q 5 days or PRN soiling.) Foam dressing to left outer leg, change Q 5 days or PRN soiling.   Increase activity slowly    Complete by:  As directed        Medication List    STOP taking these medications   methotrexate 2.5 MG tablet Commonly known as:  RHEUMATREX     TAKE these medications   BREO ELLIPTA 100-25 MCG/INH Aepb Generic drug:  fluticasone furoate-vilanterol Inhale 1 puff into the lungs daily.   collagenase ointment Commonly known as:  SANTYL Apply topically daily. Apply  Santyl to right outer leg wound Q day, then cover with moist fluffed gauze and foam dressing.  (Change foam dressing Q 5 days or PRN soiling.)   donepezil 10 MG tablet Commonly known as:  ARICEPT Take 10 mg by mouth daily.   feeding supplement Liqd Take 1 Container by mouth 3 (three) times daily between meals.   finasteride 5 MG tablet Commonly known as:  PROSCAR Take 5 mg by mouth daily.   folic acid 1 MG tablet Commonly known as:  FOLVITE Take 1 mg by mouth daily.   metoprolol 50 MG tablet Commonly known as:  LOPRESSOR Take 50 mg by mouth 2 (two) times daily.   nicotine 14 mg/24hr patch Commonly known as:  NICODERM CQ - dosed in mg/24 hours Place 1 patch (14 mg total) onto the skin daily.   nystatin powder Commonly known as:  MYCOSTATIN/NYSTOP Apply topically 2 (two) times daily. Apply  to inguinal folds   predniSONE 5 MG tablet Commonly known as:  DELTASONE Take 5 mg by mouth daily.   QUEtiapine 50 MG tablet Commonly known as:  SEROQUEL Take 1 tablet (50 mg total) by mouth at bedtime. What changed:  when to take this   saccharomyces boulardii 250 MG capsule Commonly known as:  FLORASTOR Take 1 capsule (250 mg total) by mouth 2 (two) times daily.   vancomycin 50 mg/mL oral solution Commonly known as:  VANCOCIN Take 2.5 mLs (125 mg total) by mouth 4 (four) times daily. Discontinue after 07/20/16 doses.      Follow-up Information    MD at SNF. Schedule an appointment as soon as possible for a visit in 3 day(s).   Why:  To be seen with repeat labs (CBC, BMP & EKG for QTC).       Paul Maker, MD. Schedule an appointment as soon as possible for a visit today.   Specialty:  Internal Medicine Why:  Upon discharge from SNF. Contact information: [address] [place] [address] [phone]          No Known Allergies   Procedures/Studies: Dg Forearm Right  Result Date: 07/06/2016 CLINICAL DATA:  Larey Seat today.  Right forearm pain.  EXAM: RIGHT FOREARM - 2 VIEW COMPARISON:  None. FINDINGS: The wrist and elbow joints are maintained. No acute forearm fracture. IMPRESSION: No acute fracture. Electronically Signed   By: Rudie Meyer M.D.   On: 07/06/2016 13:19   Ct Head Wo Contrast  Result Date: 07/06/2016 CLINICAL DATA:  Fall last night. Laceration above the right eye. Initial encounter. EXAM: CT HEAD WITHOUT CONTRAST CT CERVICAL SPINE WITHOUT CONTRAST TECHNIQUE: Multidetector CT imaging of the head and cervical spine was performed following the standard protocol without intravenous contrast. Multiplanar CT image reconstructions of the cervical spine were also generated. COMPARISON:  Brain MRI 12/03/2009. FINDINGS: CT HEAD FINDINGS Brain: There is moderate cerebral atrophy with greatest involvement of the parietal lobes. 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 There is no evidence of acute cortical infarct, intracranial hemorrhage, mass, midline shift, or extra-axial fluid collection. Patchy to confluent cerebral white matter hypodensities are nonspecific but compatible with moderate chronic small vessel ischemic disease, advanced for age. Vascular: Calcified atherosclerosis at the skullbase. Skull: No fracture identified. Sinuses/Orbits: Visualized paranasal sinuses and mastoid air cells are clear. Orbits are unremarkable. Other: Mild supraorbital soft tissue swelling. CT CERVICAL SPINE FINDINGS Alignment: No evidence of traumatic subluxation. Skull base and vertebrae: No fracture or destructive osseous lesion. Soft tissues and spinal canal: No prevertebral edema. Mild right-sided cervical atherosclerosis. Disc levels:  Mild cervical spondylosis, most notable at C6-7. Upper chest: Advanced centrilobular emphysema. Right greater than left apical lung scarring. Other: None. IMPRESSION: 1. No evidence of acute intracranial abnormality. 2. Mild right supraorbital soft tissue swelling. 3. Moderately advanced chronic  small vessel ischemic disease. 4. No acute abnormality identified in the cervical spine. Electronically Signed   By: Sebastian Ache M.D.   On: 07/06/2016 13:51   Ct Cervical Spine Wo Contrast  Result Date: 07/06/2016 CLINICAL DATA:  Fall last night. Laceration above the right eye. Initial encounter. EXAM: CT HEAD WITHOUT CONTRAST CT CERVICAL SPINE WITHOUT CONTRAST TECHNIQUE: Multidetector CT imaging of the head and cervical spine was performed following the standard protocol without intravenous contrast. Multiplanar CT image reconstructions of the cervical spine were also generated. COMPARISON:  Brain MRI 12/03/2009. FINDINGS: CT HEAD FINDINGS Brain: There is moderate  cerebral atrophy with greatest involvement of the parietal lobes. 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 There is no evidence of acute cortical infarct, intracranial hemorrhage, mass, midline shift, or extra-axial fluid collection. Patchy to confluent cerebral white matter hypodensities are nonspecific but compatible with moderate chronic small vessel ischemic disease, advanced for age. Vascular: Calcified atherosclerosis at the skullbase. Skull: No fracture identified. Sinuses/Orbits: Visualized paranasal sinuses and mastoid air cells are clear. Orbits are unremarkable. Other: Mild supraorbital soft tissue swelling. CT CERVICAL SPINE FINDINGS Alignment: No evidence of traumatic subluxation. Skull base and vertebrae: No fracture or destructive osseous lesion. Soft tissues and spinal canal: No prevertebral edema. Mild right-sided cervical atherosclerosis. Disc levels:  Mild cervical spondylosis, most notable at C6-7. Upper chest: Advanced centrilobular emphysema. Right greater than left apical lung scarring. Other: None. IMPRESSION: 1. No evidence of acute intracranial abnormality. 2. Mild right supraorbital soft tissue swelling. 3. Moderately advanced chronic small vessel ischemic disease. 4. No acute abnormality  identified in the cervical spine. Electronically Signed   By: Sebastian Ache M.D.   On: 07/06/2016 13:51   Dg Chest Port 1 View  Result Date: 07/07/2016 CLINICAL DATA:  Recent fall EXAM: PORTABLE CHEST 1 VIEW COMPARISON:  10/12/2015 FINDINGS: Cardiac shadow is within normal limits. The lungs are again hyperinflated. Some chronic scarring is noted in the bases bilaterally. No focal infiltrate or sizable effusion is seen. No acute bony abnormality is noted. IMPRESSION: No acute abnormality seen. Electronically Signed   By: Alcide Clever M.D.   On: 07/07/2016 08:52   Dg Knee Complete 4 Views Right  Result Date: 07/06/2016 CLINICAL DATA:  Tripped over own feet and fell, weakness, anterior RIGHT laceration EXAM: RIGHT KNEE - COMPLETE 4+ VIEW COMPARISON:  Non FINDINGS: Osseous demineralization. Joint space narrowing. No acute fracture, dislocation, or bone destruction. No knee joint effusion. Scattered vascular calcifications. IMPRESSION: Osseous demineralization with mild degenerative changes. No acute bony abnormalities identified. Electronically Signed   By: Ulyses Southward M.D.   On: 07/06/2016 13:21      Subjective: Eating breakfast, no complaints. No acute issues overnight. No diarrhea this morning.    Discharge Exam:  Vitals:   07/11/16 1034 07/11/16 1456 07/11/16 2248 07/12/16 1000  BP:  122/76 124/77 (!) 103/59  Pulse:  (!) 50 (!) 49 (!) 55  Resp:  16 16   Temp:  98.5 F (36.9 C) 97.9 F (36.6 C)   TempSrc:  Oral Oral   SpO2: 98% 100% 99%   Weight:      Height:        General exam: Eating breakfast, alert and oriented to self, NAD HEENT: Laceration sutured over right eyebrow & no acute findings. Small superficial cut on mid nose- healing. Respiratory system: CTAB, no wheezing or rhonchi Cardiovascular system: S1 & S2 heard, RRR. No JVD, murmurs, rubs, gallops or clicks. No pedal edema. Gastrointestinal system: Abdomen is nondistended, soft and nontender. NBS. Central nervous system:  Alert and oriented only to self. No focal neurological deficits, strength 5/5 in all 4 extremities. Extremities: Multiple superficial bruises in different stages on legs and the superficial wound on right upper leg.  Skin: No rashes, lesions or ulcers Psychiatry: Judgement and insight appear impaired. Mood & affect appropriate.     The results of significant diagnostics from this hospitalization (including imaging, microbiology, ancillary and laboratory) are listed below for reference.     Microbiology: Recent Results (from the past 240 hour(s))  Urine culture  Status: Abnormal   Collection Time: 07/06/16  4:27 PM  Result Value Ref Range Status   Specimen Description URINE, RANDOM  Final   Special Requests NONE  Final   Culture <10,000 COLONIES/mL INSIGNIFICANT GROWTH (A)  Final   Report Status 07/07/2016 FINAL  Final  Gastrointestinal Panel by PCR , Stool     Status: None   Collection Time: 07/06/16  6:10 PM  Result Value Ref Range Status   Campylobacter species NOT DETECTED NOT DETECTED Final   Plesimonas shigelloides NOT DETECTED NOT DETECTED Final   Salmonella species NOT DETECTED NOT DETECTED Final   Yersinia enterocolitica NOT DETECTED NOT DETECTED Final   Vibrio species NOT DETECTED NOT DETECTED Final   Vibrio cholerae NOT DETECTED NOT DETECTED Final   Enteroaggregative E coli (EAEC) NOT DETECTED NOT DETECTED Final   Enteropathogenic E coli (EPEC) NOT DETECTED NOT DETECTED Final   Enterotoxigenic E coli (ETEC) NOT DETECTED NOT DETECTED Final   Shiga like toxin producing E coli (STEC) NOT DETECTED NOT DETECTED Final   Shigella/Enteroinvasive E coli (EIEC) NOT DETECTED NOT DETECTED Final   Cryptosporidium NOT DETECTED NOT DETECTED Final   Cyclospora cayetanensis NOT DETECTED NOT DETECTED Final   Entamoeba histolytica NOT DETECTED NOT DETECTED Final   Giardia lamblia NOT DETECTED NOT DETECTED Final   Adenovirus F40/41 NOT DETECTED NOT DETECTED Final   Astrovirus NOT  DETECTED NOT DETECTED Final   Norovirus GI/GII NOT DETECTED NOT DETECTED Final   Rotavirus A NOT DETECTED NOT DETECTED Final   Sapovirus (I, II, IV, and V) NOT DETECTED NOT DETECTED Final  C difficile quick scan w PCR reflex     Status: Abnormal   Collection Time: 07/06/16  7:04 PM  Result Value Ref Range Status   C Diff antigen POSITIVE (A) NEGATIVE Final   C Diff toxin POSITIVE (A) NEGATIVE Final   C Diff interpretation Toxin producing C. difficile detected.  Final    Comment: CRITICAL RESULT CALLED TO, READ BACK BY AND VERIFIED WITH: Neill Loft RN 2044 07/06/16 A BROWNING   MRSA PCR Screening     Status: None   Collection Time: 07/07/16  4:20 AM  Result Value Ref Range Status   MRSA by PCR NEGATIVE NEGATIVE Final    Comment:        The GeneXpert MRSA Assay (FDA approved for NASAL specimens only), is one component of a comprehensive MRSA colonization surveillance program. It is not intended to diagnose MRSA infection nor to guide or monitor treatment for MRSA infections.      Labs: BNP (last 3 results) No results for input(s): BNP in the last 8760 hours. Basic Metabolic Panel:  Recent Labs Lab 07/06/16 1356 07/07/16 0339 07/07/16 0608 07/07/16 1946 07/08/16 0259 07/09/16 0529  NA 135 134*  --  139 140 135  K 3.1* 2.9*  --  3.8 3.8 4.0  CL 100* 105  --  110 114* 109  CO2 24 20*  --  23 21* 20*  GLUCOSE 103* 99  --  92 82 77  BUN 15 14  --  14 13 12   CREATININE 1.61* 1.56*  --  1.39* 1.22 1.19  CALCIUM 8.3* 7.4*  --  7.7* 7.6* 8.0*  MG  --   --  1.7  --   --   --    Liver Function Tests:  Recent Labs Lab 07/07/16 0339  AST 23  ALT 10*  ALKPHOS 74  BILITOT 0.8  PROT 5.1*  ALBUMIN 2.0*  No results for input(s): LIPASE, AMYLASE in the last 168 hours. No results for input(s): AMMONIA in the last 168 hours. CBC:  Recent Labs Lab 07/06/16 1356 07/07/16 0339 07/08/16 0259  WBC 17.5* 10.7* 10.2  NEUTROABS 15.9*  --   --   HGB 12.2* 10.0* 10.3*  HCT  35.7* 30.4* 31.5*  MCV 88.1 89.1 90.3  PLT 197 143* 151   Cardiac Enzymes:  Recent Labs Lab 07/06/16 1356  CKTOTAL 225   Urinalysis    Component Value Date/Time   COLORURINE AMBER (A) 07/06/2016 1627   APPEARANCEUR CLEAR 07/06/2016 1627   LABSPEC 1.021 07/06/2016 1627   PHURINE 6.0 07/06/2016 1627   GLUCOSEU NEGATIVE 07/06/2016 1627   HGBUR TRACE (A) 07/06/2016 1627   BILIRUBINUR MODERATE (A) 07/06/2016 1627   KETONESUR 15 (A) 07/06/2016 1627   PROTEINUR 100 (A) 07/06/2016 1627   NITRITE NEGATIVE 07/06/2016 1627   LEUKOCYTESUR TRACE (A) 07/06/2016 1627    Time coordinating discharge:   SIGNED:  Thad Ranger, MD [facility]

## 2016-07-25 ENCOUNTER — Emergency Department (HOSPITAL_COMMUNITY): Payer: Medicare HMO

## 2016-07-25 ENCOUNTER — Encounter (HOSPITAL_COMMUNITY): Payer: Self-pay | Admitting: Emergency Medicine

## 2016-07-25 ENCOUNTER — Inpatient Hospital Stay (HOSPITAL_COMMUNITY)
Admission: EM | Admit: 2016-07-25 | Discharge: 2016-07-28 | DRG: 470 | Disposition: A | Discharge status: Skilled Nursing Facility | Payer: Medicare HMO | Attending: Internal Medicine | Admitting: Internal Medicine

## 2016-07-25 DIAGNOSIS — M25552 Pain in left hip: Secondary | ICD-10-CM | POA: Diagnosis present

## 2016-07-25 DIAGNOSIS — S72019A Unspecified intracapsular fracture of unspecified femur, initial encounter for closed fracture: Secondary | ICD-10-CM | POA: Diagnosis present

## 2016-07-25 DIAGNOSIS — Z7952 Long term (current) use of systemic steroids: Secondary | ICD-10-CM | POA: Diagnosis not present

## 2016-07-25 DIAGNOSIS — S72002A Fracture of unspecified part of neck of left femur, initial encounter for closed fracture: Secondary | ICD-10-CM | POA: Diagnosis present

## 2016-07-25 DIAGNOSIS — R569 Unspecified convulsions: Secondary | ICD-10-CM | POA: Diagnosis present

## 2016-07-25 DIAGNOSIS — J449 Chronic obstructive pulmonary disease, unspecified: Secondary | ICD-10-CM | POA: Diagnosis present

## 2016-07-25 DIAGNOSIS — Y92121 Bathroom in nursing home as the place of occurrence of the external cause: Secondary | ICD-10-CM

## 2016-07-25 DIAGNOSIS — Z09 Encounter for follow-up examination after completed treatment for conditions other than malignant neoplasm: Secondary | ICD-10-CM

## 2016-07-25 DIAGNOSIS — D638 Anemia in other chronic diseases classified elsewhere: Secondary | ICD-10-CM | POA: Diagnosis present

## 2016-07-25 DIAGNOSIS — F0150 Vascular dementia without behavioral disturbance: Secondary | ICD-10-CM | POA: Diagnosis present

## 2016-07-25 DIAGNOSIS — R748 Abnormal levels of other serum enzymes: Secondary | ICD-10-CM | POA: Diagnosis present

## 2016-07-25 DIAGNOSIS — S81801A Unspecified open wound, right lower leg, initial encounter: Secondary | ICD-10-CM | POA: Diagnosis present

## 2016-07-25 DIAGNOSIS — F17200 Nicotine dependence, unspecified, uncomplicated: Secondary | ICD-10-CM | POA: Diagnosis present

## 2016-07-25 DIAGNOSIS — Z79899 Other long term (current) drug therapy: Secondary | ICD-10-CM | POA: Diagnosis not present

## 2016-07-25 DIAGNOSIS — F319 Bipolar disorder, unspecified: Secondary | ICD-10-CM | POA: Diagnosis present

## 2016-07-25 DIAGNOSIS — R262 Difficulty in walking, not elsewhere classified: Secondary | ICD-10-CM | POA: Diagnosis not present

## 2016-07-25 DIAGNOSIS — M069 Rheumatoid arthritis, unspecified: Secondary | ICD-10-CM | POA: Diagnosis present

## 2016-07-25 DIAGNOSIS — T148 Other injury of unspecified body region: Secondary | ICD-10-CM | POA: Diagnosis not present

## 2016-07-25 DIAGNOSIS — W19XXXA Unspecified fall, initial encounter: Secondary | ICD-10-CM | POA: Diagnosis present

## 2016-07-25 DIAGNOSIS — L89151 Pressure ulcer of sacral region, stage 1: Secondary | ICD-10-CM | POA: Diagnosis present

## 2016-07-25 DIAGNOSIS — Z8673 Personal history of transient ischemic attack (TIA), and cerebral infarction without residual deficits: Secondary | ICD-10-CM | POA: Diagnosis not present

## 2016-07-25 DIAGNOSIS — M05711 Rheumatoid arthritis with rheumatoid factor of right shoulder without organ or systems involvement: Secondary | ICD-10-CM | POA: Diagnosis not present

## 2016-07-25 DIAGNOSIS — W010XXA Fall on same level from slipping, tripping and stumbling without subsequent striking against object, initial encounter: Secondary | ICD-10-CM | POA: Diagnosis present

## 2016-07-25 DIAGNOSIS — F0390 Unspecified dementia without behavioral disturbance: Secondary | ICD-10-CM | POA: Diagnosis not present

## 2016-07-25 DIAGNOSIS — Z7951 Long term (current) use of inhaled steroids: Secondary | ICD-10-CM

## 2016-07-25 DIAGNOSIS — S01111A Laceration without foreign body of right eyelid and periocular area, initial encounter: Secondary | ICD-10-CM | POA: Diagnosis present

## 2016-07-25 DIAGNOSIS — S72011A Unspecified intracapsular fracture of right femur, initial encounter for closed fracture: Secondary | ICD-10-CM | POA: Diagnosis not present

## 2016-07-25 DIAGNOSIS — S72012A Unspecified intracapsular fracture of left femur, initial encounter for closed fracture: Secondary | ICD-10-CM

## 2016-07-25 DIAGNOSIS — D696 Thrombocytopenia, unspecified: Secondary | ICD-10-CM | POA: Diagnosis present

## 2016-07-25 DIAGNOSIS — D72829 Elevated white blood cell count, unspecified: Secondary | ICD-10-CM | POA: Diagnosis present

## 2016-07-25 DIAGNOSIS — T148XXA Other injury of unspecified body region, initial encounter: Secondary | ICD-10-CM

## 2016-07-25 DIAGNOSIS — I129 Hypertensive chronic kidney disease with stage 1 through stage 4 chronic kidney disease, or unspecified chronic kidney disease: Secondary | ICD-10-CM | POA: Diagnosis present

## 2016-07-25 DIAGNOSIS — N400 Enlarged prostate without lower urinary tract symptoms: Secondary | ICD-10-CM | POA: Diagnosis present

## 2016-07-25 DIAGNOSIS — N183 Chronic kidney disease, stage 3 (moderate): Secondary | ICD-10-CM | POA: Diagnosis present

## 2016-07-25 LAB — CBC WITH DIFFERENTIAL/PLATELET
Basophils Absolute: 0 10*3/uL (ref 0.0–0.1)
Basophils Relative: 0 %
EOS ABS: 0 10*3/uL (ref 0.0–0.7)
EOS PCT: 0 %
HCT: 39.4 % (ref 39.0–52.0)
HEMOGLOBIN: 12.3 g/dL — AB (ref 13.0–17.0)
LYMPHS ABS: 1.5 10*3/uL (ref 0.7–4.0)
Lymphocytes Relative: 9 %
MCH: 29.6 pg (ref 26.0–34.0)
MCHC: 31.2 g/dL (ref 30.0–36.0)
MCV: 94.9 fL (ref 78.0–100.0)
MONO ABS: 0.6 10*3/uL (ref 0.1–1.0)
MONOS PCT: 3 %
Neutro Abs: 14.8 10*3/uL — ABNORMAL HIGH (ref 1.7–7.7)
Neutrophils Relative %: 88 %
PLATELETS: 202 10*3/uL (ref 150–400)
RBC: 4.15 MIL/uL — ABNORMAL LOW (ref 4.22–5.81)
RDW: 19.5 % — AB (ref 11.5–15.5)
WBC: 16.9 10*3/uL — ABNORMAL HIGH (ref 4.0–10.5)

## 2016-07-25 LAB — COMPREHENSIVE METABOLIC PANEL
ALK PHOS: 88 U/L (ref 38–126)
ALT: 19 U/L (ref 17–63)
ANION GAP: 7 (ref 5–15)
AST: 26 U/L (ref 15–41)
Albumin: 2.9 g/dL — ABNORMAL LOW (ref 3.5–5.0)
BUN: 28 mg/dL — ABNORMAL HIGH (ref 6–20)
CO2: 20 mmol/L — AB (ref 22–32)
CREATININE: 1.35 mg/dL — AB (ref 0.61–1.24)
Calcium: 8.9 mg/dL (ref 8.9–10.3)
Chloride: 109 mmol/L (ref 101–111)
GFR calc non Af Amer: 51 mL/min — ABNORMAL LOW (ref >60)
GFR, EST AFRICAN AMERICAN: 59 mL/min — AB (ref >60)
Glucose, Bld: 154 mg/dL — ABNORMAL HIGH (ref 65–99)
Potassium: 4.3 mmol/L (ref 3.5–5.1)
SODIUM: 136 mmol/L (ref 135–145)
TOTAL PROTEIN: 6.8 g/dL (ref 6.5–8.1)
Total Bilirubin: 0.6 mg/dL (ref 0.3–1.2)

## 2016-07-25 LAB — URINALYSIS, ROUTINE W REFLEX MICROSCOPIC
Bilirubin Urine: NEGATIVE
GLUCOSE, UA: NEGATIVE mg/dL
HGB URINE DIPSTICK: NEGATIVE
Ketones, ur: NEGATIVE mg/dL
LEUKOCYTES UA: NEGATIVE
Nitrite: NEGATIVE
PH: 6 (ref 5.0–8.0)
PROTEIN: NEGATIVE mg/dL
Specific Gravity, Urine: 1.019 (ref 1.005–1.030)

## 2016-07-25 LAB — TYPE AND SCREEN
ABO/RH(D): A POS
Antibody Screen: NEGATIVE

## 2016-07-25 LAB — LIPASE, BLOOD: LIPASE: 52 U/L — AB (ref 11–51)

## 2016-07-25 MED ORDER — SACCHAROMYCES BOULARDII 250 MG PO CAPS
250.0000 mg | ORAL_CAPSULE | Freq: Two times a day (BID) | ORAL | Status: DC
  Administered 2016-07-27 – 2016-07-28 (×3): 250 mg via ORAL
  Filled 2016-07-25 (×4): qty 1

## 2016-07-25 MED ORDER — METOPROLOL TARTRATE 25 MG PO TABS
25.0000 mg | ORAL_TABLET | Freq: Two times a day (BID) | ORAL | Status: DC
  Administered 2016-07-25 – 2016-07-28 (×4): 25 mg via ORAL
  Filled 2016-07-25 (×6): qty 1

## 2016-07-25 MED ORDER — SENNOSIDES-DOCUSATE SODIUM 8.6-50 MG PO TABS
1.0000 | ORAL_TABLET | Freq: Every day | ORAL | Status: DC
  Administered 2016-07-27: 1 via ORAL
  Filled 2016-07-25 (×2): qty 1

## 2016-07-25 MED ORDER — SODIUM CHLORIDE 0.9 % IV SOLN
INTRAVENOUS | Status: DC
  Administered 2016-07-25 – 2016-07-26 (×2): via INTRAVENOUS

## 2016-07-25 MED ORDER — PREDNISONE 5 MG PO TABS
5.0000 mg | ORAL_TABLET | Freq: Every day | ORAL | Status: DC
  Administered 2016-07-27: 5 mg via ORAL
  Filled 2016-07-25: qty 1

## 2016-07-25 MED ORDER — FINASTERIDE 5 MG PO TABS
5.0000 mg | ORAL_TABLET | Freq: Every day | ORAL | Status: DC
  Administered 2016-07-27 – 2016-07-28 (×2): 5 mg via ORAL
  Filled 2016-07-25 (×2): qty 1

## 2016-07-25 MED ORDER — MORPHINE SULFATE (PF) 2 MG/ML IV SOLN
0.5000 mg | INTRAVENOUS | Status: DC | PRN
  Administered 2016-07-25: 0.5 mg via INTRAVENOUS
  Filled 2016-07-25: qty 1

## 2016-07-25 MED ORDER — IPRATROPIUM-ALBUTEROL 0.5-2.5 (3) MG/3ML IN SOLN: 3.0000 mL | RESPIRATORY_TRACT | Status: DC | PRN

## 2016-07-25 MED ORDER — ONDANSETRON HCL 4 MG/2ML IJ SOLN
4.0000 mg | Freq: Once | INTRAMUSCULAR | Status: AC
  Administered 2016-07-25: 4 mg via INTRAVENOUS
  Filled 2016-07-25: qty 2

## 2016-07-25 MED ORDER — DEXTROSE 5 % IV SOLN: 1.0000 g | INTRAVENOUS | Status: DC

## 2016-07-25 MED ORDER — NICOTINE 14 MG/24HR TD PT24
14.0000 mg | MEDICATED_PATCH | Freq: Every day | TRANSDERMAL | Status: DC
  Administered 2016-07-26 – 2016-07-28 (×3): 14 mg via TRANSDERMAL
  Filled 2016-07-25 (×3): qty 1

## 2016-07-25 MED ORDER — DONEPEZIL HCL 10 MG PO TABS
10.0000 mg | ORAL_TABLET | ORAL | Status: DC
  Administered 2016-07-27 – 2016-07-28 (×2): 10 mg via ORAL
  Filled 2016-07-25 (×2): qty 1

## 2016-07-25 MED ORDER — HYDROCODONE-ACETAMINOPHEN 5-325 MG PO TABS
1.0000 | ORAL_TABLET | Freq: Four times a day (QID) | ORAL | Status: DC | PRN
Start: 2016-07-25 — End: 2016-07-26
  Administered 2016-07-25: 2 via ORAL
  Filled 2016-07-25: qty 2

## 2016-07-25 MED ORDER — FOLIC ACID 1 MG PO TABS
1.0000 mg | ORAL_TABLET | Freq: Every day | ORAL | Status: DC
  Administered 2016-07-27 – 2016-07-28 (×2): 1 mg via ORAL
  Filled 2016-07-25 (×2): qty 1

## 2016-07-25 MED ORDER — FENTANYL CITRATE (PF) 100 MCG/2ML IJ SOLN
50.0000 ug | Freq: Once | INTRAMUSCULAR | Status: AC
  Administered 2016-07-25: 50 ug via INTRAVENOUS
  Filled 2016-07-25: qty 2

## 2016-07-25 MED ORDER — FLUTICASONE FUROATE-VILANTEROL 100-25 MCG/INH IN AEPB
1.0000 | INHALATION_SPRAY | Freq: Every day | RESPIRATORY_TRACT | Status: DC
  Administered 2016-07-26 – 2016-07-28 (×3): 1 via RESPIRATORY_TRACT
  Filled 2016-07-25: qty 28

## 2016-07-25 MED ORDER — QUETIAPINE FUMARATE 25 MG PO TABS
50.0000 mg | ORAL_TABLET | Freq: Every day | ORAL | Status: DC
  Administered 2016-07-25: 50 mg via ORAL
  Filled 2016-07-25: qty 2

## 2016-07-25 MED ORDER — ENOXAPARIN SODIUM 40 MG/0.4ML ~~LOC~~ SOLN: 40.0000 mg | SUBCUTANEOUS | Status: DC

## 2016-07-25 MED ORDER — SODIUM CHLORIDE 0.9 % IV SOLN
Freq: Once | INTRAVENOUS | Status: AC
  Administered 2016-07-25: 500 mL/h via INTRAVENOUS

## 2016-07-25 NOTE — H&P (Addendum)
History and Physical    Paul Hansen:[id number] DOB: Feb 07, 1946 DOA: 07/25/2016  Referring MD/NP/PA: Dr. Ranae Palms PCP: Dan Maker, MD  Patient coming from: [facility] via EMS  Chief Complaint: Marletta Lor  HPI: Paul Hansen is a 71 y.o. male with medical history significant of RA, HTN, CVA, vascular dementia; who presents after falling this morning at the skilled nursing facility with complaints of left hip pain. History is obtained mostly from reported as the patient is somewhat of a poor historian with history of dementia. Patient had apparently been using his walker to get out of the bathroom and tripped over his pants falling onto his left side. Patient currently not on any blood thinners and did not appear to hit his head. It was noted that the patient may have had 2 seizure-like episodes with tremor, nausea, and vomiting thereafter. Patient has no previous history of seizures. Patient was  last hospitalized from 9/7 -9/13 discharge to the skilled nursing facility after having a fall at home. He was taken off methotrexate during that hospitalization secondary to acute C. difficile infection. He was discharged home on oral antibiotics of vancomycin to been completed on 9/21.  ED Course: On admission to the emergency department patient was evaluated and seen to be afebrile with respirations up to 29, and all other vital signs seen to be relatively within normal limits. Lab work revealed WBC 16.9, BUN 28, creatinine 1.35. Imaging studies revealed a left subcapital femur fracture.  Review of Systems: As per HPI otherwise 10 point review of systems negative.   Past Medical History:  Diagnosis Date  . Bipolar disorder (HCC)   . Hypertension   . Rheumatoid arthritis (HCC)   . Stroke (HCC)   . Vascular dementia     Past Surgical History:  Procedure Laterality Date  . NO PAST SURGERIES       reports that he has been smoking.  He has never used smokeless tobacco. He reports that  he does not drink alcohol or use drugs.  No Known Allergies  Family History  Problem Relation Age of Onset  . Family history unknown: Yes    Prior to Admission medications   Medication Sig Start Date End Date Taking? Authorizing Provider  donepezil (ARICEPT) 10 MG tablet Take 10 mg by mouth every morning.    Yes Historical Provider, MD  finasteride (PROSCAR) 5 MG tablet Take 5 mg by mouth daily.   Yes Historical Provider, MD  fluticasone furoate-vilanterol (BREO ELLIPTA) 100-25 MCG/INH AEPB Inhale 1 puff into the lungs daily.   Yes Historical Provider, MD  folic acid (FOLVITE) 1 MG tablet Take 1 mg by mouth daily.   Yes Historical Provider, MD  metoprolol (LOPRESSOR) 50 MG tablet Take 25 mg by mouth 2 (two) times daily.    Yes Historical Provider, MD  nicotine (NICODERM CQ - DOSED IN MG/24 HOURS) 14 mg/24hr patch Place 1 patch (14 mg total) onto the skin daily. 07/11/16  Yes Elease Etienne, MD  predniSONE (DELTASONE) 5 MG tablet Take 5 mg by mouth daily.   Yes Historical Provider, MD  QUEtiapine (SEROQUEL) 50 MG tablet Take 1 tablet (50 mg total) by mouth at bedtime. 07/11/16  Yes Elease Etienne, MD  saccharomyces boulardii (FLORASTOR) 250 MG capsule Take 1 capsule (250 mg total) by mouth 2 (two) times daily. 07/11/16  Yes Elease Etienne, MD  collagenase (SANTYL) ointment Apply topically daily. Apply Santyl to right outer leg wound Q day, then cover with moist  fluffed gauze and foam dressing.  (Change foam dressing Q 5 days or PRN soiling.) Patient not taking: Reported on 07/25/2016 07/11/16   Elease Etienne, MD  feeding supplement (BOOST / RESOURCE BREEZE) LIQD Take 1 Container by mouth 3 (three) times daily between meals. Patient not taking: Reported on 07/25/2016 07/11/16   Elease Etienne, MD  nystatin (MYCOSTATIN/NYSTOP) powder Apply topically 2 (two) times daily. Apply to inguinal folds Patient not taking: Reported on 07/25/2016 07/11/16   Elease Etienne, MD  vancomycin (VANCOCIN)  50 mg/mL oral solution Take 2.5 mLs (125 mg total) by mouth 4 (four) times daily. Discontinue after 07/20/16 doses. Patient not taking: Reported on 07/25/2016 07/11/16   Elease Etienne, MD    Physical Exam:    Constitutional: Elderly male who appears in moderate discomfort Vitals:   07/25/16 1822 07/25/16 1900 07/25/16 1915 07/25/16 1930  BP: 128/82 138/86 133/81 154/87  Pulse: 69 60 63 65  Resp: 22 (!) 29 22 (!) 28  Temp:      TempSrc:      SpO2: 92% (!) 89% 93% (!) 78%  Weight:      Height:       Eyes: PERRL, lids and conjunctivae normal ENMT: Mucous membranes are moist. Posterior pharynx clear of any exudate or lesions. No teeth present.  Neck: normal, supple, no masses, no thyromegaly Respiratory: Decreased overall air movement, no wheezing, no crackles. Tachypneic. No accessory muscle use.  Cardiovascular: Bradycardic, no murmurs / rubs / gallops. No extremity edema. 2+ pedal pulses. No carotid bruits.  Abdomen: no tenderness, no masses palpated. No hepatosplenomegaly. Bowel sounds positive.  Musculoskeletal: no clubbing / cyanosis. Left leg is shortened and externally rotated with tenderness to palpation of the left hip. Skin: Multiple scrapes and Bruises noted of the face and arms that appear to be healing. Decubitus ulcer stage I present. Neurologic: CN 2-12 grossly intact. Sensation intact, DTR normal. Strength 4+/5 in all 4.  Psychiatric: Poor memory. Alert and oriented x 1. Normal mood.     Labs on Admission: I have personally reviewed following labs and imaging studies  CBC:  Recent Labs Lab 07/25/16 1612  WBC 16.9*  NEUTROABS 14.8*  HGB 12.3*  HCT 39.4  MCV 94.9  PLT 202   Basic Metabolic Panel:  Recent Labs Lab 07/25/16 1612  NA 136  K 4.3  CL 109  CO2 20*  GLUCOSE 154*  BUN 28*  CREATININE 1.35*  CALCIUM 8.9   GFR: Estimated Creatinine Clearance: 37.1 mL/min (by C-G formula based on SCr of 1.35 mg/dL (H)). Liver Function Tests:  Recent  Labs Lab 07/25/16 1612  AST 26  ALT 19  ALKPHOS 88  BILITOT 0.6  PROT 6.8  ALBUMIN 2.9*    Recent Labs Lab 07/25/16 1612  LIPASE 52*   No results for input(s): AMMONIA in the last 168 hours. Coagulation Profile: No results for input(s): INR, PROTIME in the last 168 hours. Cardiac Enzymes: No results for input(s): CKTOTAL, CKMB, CKMBINDEX, TROPONINI in the last 168 hours. BNP (last 3 results) No results for input(s): PROBNP in the last 8760 hours. HbA1C: No results for input(s): HGBA1C in the last 72 hours. CBG: No results for input(s): GLUCAP in the last 168 hours. Lipid Profile: No results for input(s): CHOL, HDL, LDLCALC, TRIG, CHOLHDL, LDLDIRECT in the last 72 hours. Thyroid Function Tests: No results for input(s): TSH, T4TOTAL, FREET4, T3FREE, THYROIDAB in the last 72 hours. Anemia Panel: No results for input(s): VITAMINB12, FOLATE, FERRITIN, TIBC,  IRON, RETICCTPCT in the last 72 hours. Urine analysis:    Component Value Date/Time   COLORURINE AMBER (A) 07/06/2016 1627   APPEARANCEUR CLEAR 07/06/2016 1627   LABSPEC 1.021 07/06/2016 1627   PHURINE 6.0 07/06/2016 1627   GLUCOSEU NEGATIVE 07/06/2016 1627   HGBUR TRACE (A) 07/06/2016 1627   BILIRUBINUR MODERATE (A) 07/06/2016 1627   KETONESUR 15 (A) 07/06/2016 1627   PROTEINUR 100 (A) 07/06/2016 1627   NITRITE NEGATIVE 07/06/2016 1627   LEUKOCYTESUR TRACE (A) 07/06/2016 1627   Sepsis Labs: No results found for this or any previous visit (from the past 240 hour(s)).   Radiological Exams on Admission: Dg Chest 1 View  Result Date: 07/25/2016 CLINICAL DATA:  Larey Seat this morning and injured left hip. EXAM: CHEST 1 VIEW COMPARISON:  07/07/2016 FINDINGS: The cardiac silhouette, mediastinal and hilar contours are within normal limits and stable. Moderate tortuosity of the thoracic aorta. Stable emphysematous changes and pulmonary scarring. No definite acute overlying pulmonary process. The bony thorax is intact.  IMPRESSION: Emphysematous changes and pulmonary scarring but no acute overlying pulmonary findings. Electronically Signed   By: Rudie Meyer M.D.   On: 07/25/2016 15:56   Ct Head Wo Contrast  Result Date: 07/25/2016 CLINICAL DATA:  Status post fall this morning with left-sided hip pain. Vomiting after fall. EXAM: CT HEAD WITHOUT CONTRAST CT CERVICAL SPINE WITHOUT CONTRAST TECHNIQUE: Multidetector CT imaging of the head and cervical spine was performed following the standard protocol without intravenous contrast. Multiplanar CT image reconstructions of the cervical spine were also generated. COMPARISON:  CT head and cervical spine dated 07/06/2016. FINDINGS: CT HEAD FINDINGS Brain: Again noted is generalized age related parenchymal atrophy with commensurate dilatation of the ventricles and sulci. Again noted are confluent areas of low density within the bilateral periventricular and subcortical white matter regions consistent with changes of chronic small vessel ischemia. Small old lacunar infarcts again noted within the left basal ganglia. There is no mass, hemorrhage, edema or other evidence of acute parenchymal abnormality. No extra-axial hemorrhage. Vascular: There are chronic calcified atherosclerotic changes of the large vessels at the skull base. No unexpected hyperdense vessel. Skull: Normal. Negative for fracture or focal lesion. Sinuses/Orbits: No acute finding. Other: None CT CERVICAL SPINE FINDINGS Alignment: Dextroscoliosis of the lower cervical spine. Alignment otherwise normal. Skull base and vertebrae: No fracture line or displaced fracture fragment identified. Facet joints appear intact and normally aligned. Soft tissues and spinal canal: No prevertebral fluid or swelling. No visible canal hematoma. Disc levels: Mild degenerative change without significant central canal stenosis. Upper chest: Severe emphysematous changes within each lung apex. Other: None IMPRESSION: 1. No acute intracranial  abnormality. No intracranial mass, hemorrhage or edema. No skull fracture. Atrophy and chronic ischemic changes, as detailed above. 2. No fracture or acute subluxation within the cervical spine. Scoliosis and mild degenerative change. 3. Severe emphysematous changes within the lung apices. Electronically Signed   By: Bary Richard M.D.   On: 07/25/2016 16:16   Ct Cervical Spine Wo Contrast  Result Date: 07/25/2016 CLINICAL DATA:  Status post fall this morning with left-sided hip pain. Vomiting after fall. EXAM: CT HEAD WITHOUT CONTRAST CT CERVICAL SPINE WITHOUT CONTRAST TECHNIQUE: Multidetector CT imaging of the head and cervical spine was performed following the standard protocol without intravenous contrast. Multiplanar CT image reconstructions of the cervical spine were also generated. COMPARISON:  CT head and cervical spine dated 07/06/2016. FINDINGS: CT HEAD FINDINGS Brain: Again noted is generalized age related parenchymal atrophy with commensurate dilatation  of the ventricles and sulci. Again noted are confluent areas of low density within the bilateral periventricular and subcortical white matter regions consistent with changes of chronic small vessel ischemia. Small old lacunar infarcts again noted within the left basal ganglia. There is no mass, hemorrhage, edema or other evidence of acute parenchymal abnormality. No extra-axial hemorrhage. Vascular: There are chronic calcified atherosclerotic changes of the large vessels at the skull base. No unexpected hyperdense vessel. Skull: Normal. Negative for fracture or focal lesion. Sinuses/Orbits: No acute finding. Other: None CT CERVICAL SPINE FINDINGS Alignment: Dextroscoliosis of the lower cervical spine. Alignment otherwise normal. Skull base and vertebrae: No fracture line or displaced fracture fragment identified. Facet joints appear intact and normally aligned. Soft tissues and spinal canal: No prevertebral fluid or swelling. No visible canal  hematoma. Disc levels: Mild degenerative change without significant central canal stenosis. Upper chest: Severe emphysematous changes within each lung apex. Other: None IMPRESSION: 1. No acute intracranial abnormality. No intracranial mass, hemorrhage or edema. No skull fracture. Atrophy and chronic ischemic changes, as detailed above. 2. No fracture or acute subluxation within the cervical spine. Scoliosis and mild degenerative change. 3. Severe emphysematous changes within the lung apices. Electronically Signed   By: Bary RichardStan  Maynard M.D.   On: 07/25/2016 16:16   Dg Knee Complete 4 Views Left  Result Date: 07/25/2016 CLINICAL DATA:  Acute onset of generalized left knee pain after fall. Initial encounter. EXAM: LEFT KNEE - COMPLETE 4+ VIEW COMPARISON:  None. FINDINGS: There is no evidence of fracture or dislocation. The joint spaces are preserved. No significant degenerative change is seen; the patellofemoral joint is grossly unremarkable in appearance. Trace knee joint fluid is within normal limits. A small enthesophyte is noted at the upper pole of the patella. Scattered vascular calcifications are seen. IMPRESSION: 1. No evidence of fracture or dislocation. 2. Scattered vascular calcifications seen. Electronically Signed   By: Roanna RaiderJeffery  Chang M.D.   On: 07/25/2016 18:29   Dg Hip Unilat W Or Wo Pelvis 2-3 Views Left  Result Date: 07/25/2016 CLINICAL DATA:  Fall this morning.  Left hip pain. EXAM: DG HIP (WITH OR WITHOUT PELVIS) 2-3V LEFT COMPARISON:  None. FINDINGS: Fracture of the proximal left femur with superior displacement of the distal component. This fracture is at the left femoral head and neck junction and compatible with a subcapital fracture. Prostate calcifications at the level of the pubic symphysis. Normal bowel gas pattern. Pubic rami are intact. Left femoral head is located. Right hip is grossly intact. IMPRESSION: Subcapital fracture in the left femur. Electronically Signed   By: Richarda OverlieAdam  Henn  M.D.   On: 07/25/2016 15:57    EKG: Independently reviewed. Sinus rhythm with left atrial enlargement noted.  Assessment/Plan Fall with left subcapital femur fracture: Acute. Risk factors include chronic steroid use. - Admit to telemetry bed - Hip fracture order set initiated - pain control prn - Dr. Leroy LibmanSwintech consulted in the ED, f/u with orthopedics    Leukocytosis WBC 16.9 on admission. The differential includes infection(patient just recently treated for C. difficile infection during last hospitalization) versus chronic steroids versus reactive. - Follow- up UA - Repeat CBC in a.m. - strict I&Os monitor for diarrhea  Chronic kidney disease stage III: Creatinine near baseline, but patient noted to have elevation in BUN to 28 suggesting possible signs of dehydration. - Gentle IV fluids  Vascular Dementia  - continue Donepezil  COPD without acute exacerbation  - continue Breo - Duonebs prn SOB/Wheezing  Essential hypertension  -  continue metoprolol   Elevated lipase: Mild elevation to 52. No significant abdominal pain noted on physical exam -  Continue to monitor Tobacco abuse - continue nicotine patch  Rheumatoid arthritis - Continue prednisone -  methotrexate not restarted at this time question, reevaluate and determine when acceptable to restart   BPH - continue finasteride   DVT prophylaxis: Lovenox Code Status: Full Family Communication: No family present at bedside Disposition Plan: will likely need discharge back to SNF Consults called: Orthopedic Admission status: Inpatient  Clydie Braun MD [facility] Pager [phone]  If 7PM-7AM, please contact night-coverage [email or web address] Password [facility]  07/25/2016, 7:59 PM

## 2016-07-25 NOTE — ED Triage Notes (Signed)
Pt in from [facility] via [facility] after tripping and falling this morning, sustaining L sided hip pain. Pt was using walker to get to bathroom, tripped over pants and fell onto L side. Pt recalls event, denies hitting head, doesn't take any blood thinners. L side leg shortening/obvious deformity noted, bilateral pedal pulses +1. Per SNF, pt had 2 "seizure-like" events and vomitting after the fall. Pt has no hx of seizures. Pt's BP was 88/60 on EMS arrival, 768ml's NS given IV, as well as Fentanyl and 4mg  Zofran. Pt arrives alert, crying in pain, asking for pain medicine. CBG 161, last BP 114/76

## 2016-07-25 NOTE — ED Notes (Signed)
Patient transported to X-ray 

## 2016-07-25 NOTE — ED Provider Notes (Signed)
[facility]-EMERGENCY DEPT Provider Note   CSN: [id number] Arrival date & time: 07/25/16  1425     History   Chief Complaint Chief Complaint  Patient presents with  . Fall  . Hip Pain  . seizure like activity    HPI Paul BeckwithJohn D Hansen is a 70 y.o. male.  HPI Patient is a very poor historian. Recently fall and hospitalized and then discharged to skilled nursing facility. Patient states he tripped and fell while walking to the bathroom. He landed on his left hip. He complains of left hip pain. He's not been ambulating since. He denies being on any type of blood thinners. Denies any numbness in the leg. Per EMS, nursing home staff reported seizure-like movements. Patient denies loss of consciousness or head injury. No previous history of seizure disorder. Given fentanyl en route and was mildly hypotensive when arrived in the emergency department. Denies lightheadedness.  Past Medical History:  Diagnosis Date  . Bipolar disorder (HCC)   . Hypertension   . Rheumatoid arthritis (HCC)   . Stroke (HCC)   . Vascular dementia     Patient Active Problem List   Diagnosis Date Noted  . Subcapital fracture of femur (HCC) 07/25/2016  . Diarrhea 07/06/2016  . Fall 07/06/2016  . Dementia 07/06/2016  . HTN (hypertension) 07/06/2016  . Bipolar disorder (HCC) 07/06/2016  . History of CVA (cerebrovascular accident) 07/06/2016  . RA (rheumatoid arthritis) (HCC) 07/06/2016  . AKI (acute kidney injury) (HCC) 07/06/2016  . Dehydration 07/06/2016  . Anemia 07/06/2016  . Hypokalemia 07/06/2016  . Decubitus ulcer of sacral region, stage 1 07/06/2016  . Tinea 07/06/2016    Past Surgical History:  Procedure Laterality Date  . NO PAST SURGERIES         Home Medications    Prior to Admission medications   Medication Sig Start Date End Date Taking? Authorizing Provider  donepezil (ARICEPT) 10 MG tablet Take 10 mg by mouth every morning.    Yes Historical Provider, MD  finasteride (PROSCAR) 5 MG  tablet Take 5 mg by mouth daily.   Yes Historical Provider, MD  fluticasone furoate-vilanterol (BREO ELLIPTA) 100-25 MCG/INH AEPB Inhale 1 puff into the lungs daily.   Yes Historical Provider, MD  folic acid (FOLVITE) 1 MG tablet Take 1 mg by mouth daily.   Yes Historical Provider, MD  metoprolol (LOPRESSOR) 50 MG tablet Take 25 mg by mouth 2 (two) times daily.    Yes Historical Provider, MD  nicotine (NICODERM CQ - DOSED IN MG/24 HOURS) 14 mg/24hr patch Place 1 patch (14 mg total) onto the skin daily. 07/11/16  Yes Elease EtienneAnand D Hongalgi, MD  predniSONE (DELTASONE) 5 MG tablet Take 5 mg by mouth daily.   Yes Historical Provider, MD  QUEtiapine (SEROQUEL) 50 MG tablet Take 1 tablet (50 mg total) by mouth at bedtime. 07/11/16  Yes Elease EtienneAnand D Hongalgi, MD  saccharomyces boulardii (FLORASTOR) 250 MG capsule Take 1 capsule (250 mg total) by mouth 2 (two) times daily. 07/11/16  Yes Elease EtienneAnand D Hongalgi, MD  collagenase (SANTYL) ointment Apply topically daily. Apply Santyl to right outer leg wound Q day, then cover with moist fluffed gauze and foam dressing.  (Change foam dressing Q 5 days or PRN soiling.) Patient not taking: Reported on 07/25/2016 07/11/16   Elease EtienneAnand D Hongalgi, MD  feeding supplement (BOOST / RESOURCE BREEZE) LIQD Take 1 Container by mouth 3 (three) times daily between meals. Patient not taking: Reported on 07/25/2016 07/11/16   Elease EtienneAnand D Hongalgi, MD  nystatin (MYCOSTATIN/NYSTOP) powder Apply topically 2 (two) times daily. Apply to inguinal folds Patient not taking: Reported on 07/25/2016 07/11/16   Elease Etienne, MD  vancomycin (VANCOCIN) 50 mg/mL oral solution Take 2.5 mLs (125 mg total) by mouth 4 (four) times daily. Discontinue after 07/20/16 doses. Patient not taking: Reported on 07/25/2016 07/11/16   Elease Etienne, MD    Family History Family History  Problem Relation Age of Onset  . Family history unknown: Yes    Social History Social History  Substance Use Topics  . Smoking status: Current  Every Day Smoker  . Smokeless tobacco: Never Used  . Alcohol use No     Allergies   Review of patient's allergies indicates no known allergies.   Review of Systems Review of Systems  Constitutional: Negative for chills and fever.  Eyes: Negative for visual disturbance.  Respiratory: Negative for shortness of breath.   Cardiovascular: Negative for chest pain.  Gastrointestinal: Positive for nausea. Negative for abdominal pain and vomiting.  Genitourinary: Negative for flank pain.  Musculoskeletal: Positive for arthralgias. Negative for back pain, myalgias and neck pain.  Neurological: Negative for dizziness, syncope, weakness, light-headedness, numbness and headaches.  All other systems reviewed and are negative.    Physical Exam Updated Vital Signs BP (!) 149/88 (BP Location: Right Arm)   Pulse 78   Temp 98.2 F (36.8 C) (Oral)   Resp (!) 22   Ht 5\' 11"  (1.803 m)   Wt 132 lb 0.9 oz (59.9 kg)   SpO2 91%   BMI 18.42 kg/m   Physical Exam  Constitutional: He is oriented to person, place, and time. He appears well-developed and well-nourished. No distress.  HENT:  Head: Normocephalic and atraumatic.  Mouth/Throat: Oropharynx is clear and moist.  Healing laceration over the right eyebrow. No obvious acute trauma. No intraoral trauma  Eyes: EOM are normal. Pupils are equal, round, and reactive to light.  Neck:  Patient is in a cervical collar.  Cardiovascular: Normal rate and regular rhythm.   Pulmonary/Chest: Effort normal and breath sounds normal. No respiratory distress. He has no wheezes. He has no rales. He exhibits no tenderness.  Abdominal: Soft. Bowel sounds are normal. There is tenderness (ild lower abdominal tenderness to palpation.o rebound or guarding.). There is no rebound and no guarding.  Musculoskeletal: He exhibits no edema or tenderness.  Decreased range of motion to the left hip. Questionable mild shortening. 2+ dorsalis pedis posterior tibial pulses. No  lower extremity swelling.   Neurological: He is alert and oriented to person, place, and time.  Patient is poor historian and confused at times. Moves bilateral upper and right lower extremity without any deficit. Decreased movement due to pain in the right lower extremity. Sensation is fully intact.  Skin: Skin is warm and dry. Capillary refill takes less than 2 seconds. No rash noted. No erythema.  Psychiatric: He has a normal mood and affect. His behavior is normal.  Nursing note and vitals reviewed.    ED Treatments / Results  Labs (all labs ordered are listed, but only abnormal results are displayed) Labs Reviewed  CBC WITH DIFFERENTIAL/PLATELET - Abnormal; Notable for the following:       Result Value   WBC 16.9 (*)    RBC 4.15 (*)    Hemoglobin 12.3 (*)    RDW 19.5 (*)    Neutro Abs 14.8 (*)    All other components within normal limits  COMPREHENSIVE METABOLIC PANEL - Abnormal; Notable for the following:  CO2 20 (*)    Glucose, Bld 154 (*)    BUN 28 (*)    Creatinine, Ser 1.35 (*)    Albumin 2.9 (*)    GFR calc non Af Amer 51 (*)    GFR calc Af Amer 59 (*)    All other components within normal limits  LIPASE, BLOOD - Abnormal; Notable for the following:    Lipase 52 (*)    All other components within normal limits  MRSA PCR SCREENING  URINALYSIS, ROUTINE W REFLEX MICROSCOPIC (NOT AT [facility])  CBC  BASIC METABOLIC PANEL  TYPE AND SCREEN  ABO/RH    EKG  EKG Interpretation None       Radiology Dg Chest 1 View  Result Date: 07/25/2016 CLINICAL DATA:  Larey Seat this morning and injured left hip. EXAM: CHEST 1 VIEW COMPARISON:  07/07/2016 FINDINGS: The cardiac silhouette, mediastinal and hilar contours are within normal limits and stable. Moderate tortuosity of the thoracic aorta. Stable emphysematous changes and pulmonary scarring. No definite acute overlying pulmonary process. The bony thorax is intact. IMPRESSION: Emphysematous changes and pulmonary scarring but no  acute overlying pulmonary findings. Electronically Signed   By: Rudie Meyer M.D.   On: 07/25/2016 15:56   Ct Head Wo Contrast  Result Date: 07/25/2016 CLINICAL DATA:  Status post fall this morning with left-sided hip pain. Vomiting after fall. EXAM: CT HEAD WITHOUT CONTRAST CT CERVICAL SPINE WITHOUT CONTRAST TECHNIQUE: Multidetector CT imaging of the head and cervical spine was performed following the standard protocol without intravenous contrast. Multiplanar CT image reconstructions of the cervical spine were also generated. COMPARISON:  CT head and cervical spine dated 07/06/2016. FINDINGS: CT HEAD FINDINGS Brain: Again noted is generalized age related parenchymal atrophy with commensurate dilatation of the ventricles and sulci. Again noted are confluent areas of low density within the bilateral periventricular and subcortical white matter regions consistent with changes of chronic small vessel ischemia. Small old lacunar infarcts again noted within the left basal ganglia. There is no mass, hemorrhage, edema or other evidence of acute parenchymal abnormality. No extra-axial hemorrhage. Vascular: There are chronic calcified atherosclerotic changes of the large vessels at the skull base. No unexpected hyperdense vessel. Skull: Normal. Negative for fracture or focal lesion. Sinuses/Orbits: No acute finding. Other: None CT CERVICAL SPINE FINDINGS Alignment: Dextroscoliosis of the lower cervical spine. Alignment otherwise normal. Skull base and vertebrae: No fracture line or displaced fracture fragment identified. Facet joints appear intact and normally aligned. Soft tissues and spinal canal: No prevertebral fluid or swelling. No visible canal hematoma. Disc levels: Mild degenerative change without significant central canal stenosis. Upper chest: Severe emphysematous changes within each lung apex. Other: None IMPRESSION: 1. No acute intracranial abnormality. No intracranial mass, hemorrhage or edema. No skull  fracture. Atrophy and chronic ischemic changes, as detailed above. 2. No fracture or acute subluxation within the cervical spine. Scoliosis and mild degenerative change. 3. Severe emphysematous changes within the lung apices. Electronically Signed   By: Bary Richard M.D.   On: 07/25/2016 16:16   Ct Cervical Spine Wo Contrast  Result Date: 07/25/2016 CLINICAL DATA:  Status post fall this morning with left-sided hip pain. Vomiting after fall. EXAM: CT HEAD WITHOUT CONTRAST CT CERVICAL SPINE WITHOUT CONTRAST TECHNIQUE: Multidetector CT imaging of the head and cervical spine was performed following the standard protocol without intravenous contrast. Multiplanar CT image reconstructions of the cervical spine were also generated. COMPARISON:  CT head and cervical spine dated 07/06/2016. FINDINGS: CT HEAD FINDINGS Brain: Again  noted is generalized age related parenchymal atrophy with commensurate dilatation of the ventricles and sulci. Again noted are confluent areas of low density within the bilateral periventricular and subcortical white matter regions consistent with changes of chronic small vessel ischemia. Small old lacunar infarcts again noted within the left basal ganglia. There is no mass, hemorrhage, edema or other evidence of acute parenchymal abnormality. No extra-axial hemorrhage. Vascular: There are chronic calcified atherosclerotic changes of the large vessels at the skull base. No unexpected hyperdense vessel. Skull: Normal. Negative for fracture or focal lesion. Sinuses/Orbits: No acute finding. Other: None CT CERVICAL SPINE FINDINGS Alignment: Dextroscoliosis of the lower cervical spine. Alignment otherwise normal. Skull base and vertebrae: No fracture line or displaced fracture fragment identified. Facet joints appear intact and normally aligned. Soft tissues and spinal canal: No prevertebral fluid or swelling. No visible canal hematoma. Disc levels: Mild degenerative change without significant  central canal stenosis. Upper chest: Severe emphysematous changes within each lung apex. Other: None IMPRESSION: 1. No acute intracranial abnormality. No intracranial mass, hemorrhage or edema. No skull fracture. Atrophy and chronic ischemic changes, as detailed above. 2. No fracture or acute subluxation within the cervical spine. Scoliosis and mild degenerative change. 3. Severe emphysematous changes within the lung apices. Electronically Signed   By: Bary Richard M.D.   On: 07/25/2016 16:16   Dg Knee Complete 4 Views Left  Result Date: 07/25/2016 CLINICAL DATA:  Acute onset of generalized left knee pain after fall. Initial encounter. EXAM: LEFT KNEE - COMPLETE 4+ VIEW COMPARISON:  None. FINDINGS: There is no evidence of fracture or dislocation. The joint spaces are preserved. No significant degenerative change is seen; the patellofemoral joint is grossly unremarkable in appearance. Trace knee joint fluid is within normal limits. A small enthesophyte is noted at the upper pole of the patella. Scattered vascular calcifications are seen. IMPRESSION: 1. No evidence of fracture or dislocation. 2. Scattered vascular calcifications seen. Electronically Signed   By: Roanna Raider M.D.   On: 07/25/2016 18:29   Dg Hip Unilat W Or Wo Pelvis 2-3 Views Left  Result Date: 07/25/2016 CLINICAL DATA:  Fall this morning.  Left hip pain. EXAM: DG HIP (WITH OR WITHOUT PELVIS) 2-3V LEFT COMPARISON:  None. FINDINGS: Fracture of the proximal left femur with superior displacement of the distal component. This fracture is at the left femoral head and neck junction and compatible with a subcapital fracture. Prostate calcifications at the level of the pubic symphysis. Normal bowel gas pattern. Pubic rami are intact. Left femoral head is located. Right hip is grossly intact. IMPRESSION: Subcapital fracture in the left femur. Electronically Signed   By: Richarda Overlie M.D.   On: 07/25/2016 15:57    Procedures Procedures (including  critical care time)  Medications Ordered in ED Medications  finasteride (PROSCAR) tablet 5 mg (not administered)  donepezil (ARICEPT) tablet 10 mg (not administered)  fluticasone furoate-vilanterol (BREO ELLIPTA) 100-25 MCG/INH 1 puff (1 puff Inhalation Not Given 07/25/16 2139)  folic acid (FOLVITE) tablet 1 mg (not administered)  metoprolol tartrate (LOPRESSOR) tablet 25 mg (25 mg Oral Given 07/25/16 2319)  nicotine (NICODERM CQ - dosed in mg/24 hours) patch 14 mg (not administered)  saccharomyces boulardii (FLORASTOR) capsule 250 mg (not administered)  QUEtiapine (SEROQUEL) tablet 50 mg (50 mg Oral Given 07/25/16 2319)  predniSONE (DELTASONE) tablet 5 mg (not administered)  HYDROcodone-acetaminophen (NORCO/VICODIN) 5-325 MG per tablet 1-2 tablet (2 tablets Oral Given 07/25/16 2320)  morphine 2 MG/ML injection 0.5 mg (0.5 mg Intravenous  Given 07/25/16 2256)  senna-docusate (Senokot-S) tablet 1 tablet (1 tablet Oral Not Given 07/25/16 2319)  0.9 %  sodium chloride infusion ( Intravenous New Bag/Given 07/25/16 2301)  ipratropium-albuterol (DUONEB) 0.5-2.5 (3) MG/3ML nebulizer solution 3 mL (not administered)  ondansetron (ZOFRAN) injection 4 mg (4 mg Intravenous Given 07/25/16 1618)  fentaNYL (SUBLIMAZE) injection 50 mcg (50 mcg Intravenous Given 07/25/16 1707)  0.9 %  sodium chloride infusion ( Intravenous Stopped 07/25/16 1821)     Initial Impression / Assessment and Plan / ED Course  I have reviewed the triage vital signs and the nursing notes.  Pertinent labs & imaging results that were available during my care of the patient were reviewed by me and considered in my medical decision making (see chart for details).  Clinical Course    No previous orthopedic procedures. Doesn't know when he ate last. Discussed with Dr. Linna Caprice. States he will not take the patient to the OR tonight. With [facility] hospitalist, Dr. Katrinka Blazing. Will admit. Final Clinical Impressions(s) / ED Diagnoses   Final  diagnoses:  Fall  Subcapital fracture of hip, left, closed, initial encounter [facility])    New Prescriptions Current Discharge Medication List       Loren Racer, MD 07/25/16 [phone]

## 2016-07-25 NOTE — Consult Note (Signed)
Paul Hansen is an 70 y.o. male.    Chief Complaint: left hip pain  HPI: 70 y/o male resident at nursing facility sustained a ground level fall earlier this morning while walking. C/o immediate left hip pain and inability to bear weight. Denies any other injuries s/p this fall. Denies any LOC, dizziness but report of possible seizure like activity with vomiting after the fall. Staff who report the incident has left for the day and no further information able to be obtained. Currently awake, appropriate and answers questions easily.  PCP:  Myrtis Hopping, MD  PMH: Past Medical History:  Diagnosis Date  . Bipolar disorder ([place])   . Hypertension   . Rheumatoid arthritis ([place])   . Stroke ([place])   . Vascular dementia     PSH: Past Surgical History:  Procedure Laterality Date  . NO PAST SURGERIES      Social History:  reports that he has been smoking.  He has never used smokeless tobacco. He reports that he does not drink alcohol or use drugs.  Allergies:  No Known Allergies  Medications: Current Facility-Administered Medications  Medication Dose Route Frequency Provider Last Rate Last Dose  . [START ON 07/26/2016] donepezil (ARICEPT) tablet 10 mg  10 mg Oral BH-q7a Norval Morton, MD      . Derrill Memo ON 07/26/2016] enoxaparin (LOVENOX) injection 40 mg  40 mg Subcutaneous Q24H Rondell A Tamala Julian, MD      . finasteride (PROSCAR) tablet 5 mg  5 mg Oral Daily Rondell A Tamala Julian, MD      . fluticasone furoate-vilanterol (BREO ELLIPTA) 100-25 MCG/INH 1 puff  1 puff Inhalation Daily Rondell Charmayne Sheer, MD      . folic acid (FOLVITE) tablet 1 mg  1 mg Oral Daily Rondell A Tamala Julian, MD      . HYDROcodone-acetaminophen (NORCO/VICODIN) 5-325 MG per tablet 1-2 tablet  1-2 tablet Oral Q6H PRN Norval Morton, MD      . metoprolol tartrate (LOPRESSOR) tablet 25 mg  25 mg Oral BID Rondell A Tamala Julian, MD      . morphine 2 MG/ML injection 0.5 mg  0.5 mg Intravenous Q2H PRN Rondell A Tamala Julian, MD      . nicotine  (NICODERM CQ - dosed in mg/24 hours) patch 14 mg  14 mg Transdermal Daily Rondell A Tamala Julian, MD      . predniSONE (DELTASONE) tablet 5 mg  5 mg Oral Daily Rondell A Tamala Julian, MD      . QUEtiapine (SEROQUEL) tablet 50 mg  50 mg Oral QHS Rondell A Smith, MD      . saccharomyces boulardii (FLORASTOR) capsule 250 mg  250 mg Oral BID Rondell A Tamala Julian, MD      . senna-docusate (Senokot-S) tablet 1 tablet  1 tablet Oral QHS Norval Morton, MD       Current Outpatient Prescriptions  Medication Sig Dispense Refill  . donepezil (ARICEPT) 10 MG tablet Take 10 mg by mouth every morning.     . finasteride (PROSCAR) 5 MG tablet Take 5 mg by mouth daily.    . fluticasone furoate-vilanterol (BREO ELLIPTA) 100-25 MCG/INH AEPB Inhale 1 puff into the lungs daily.    . folic acid (FOLVITE) 1 MG tablet Take 1 mg by mouth daily.    . metoprolol (LOPRESSOR) 50 MG tablet Take 25 mg by mouth 2 (two) times daily.     . nicotine (NICODERM CQ - DOSED IN MG/24 HOURS) 14 mg/24hr patch Place 1 patch (14 mg  total) onto the skin daily.    . predniSONE (DELTASONE) 5 MG tablet Take 5 mg by mouth daily.    . QUEtiapine (SEROQUEL) 50 MG tablet Take 1 tablet (50 mg total) by mouth at bedtime.    . saccharomyces boulardii (FLORASTOR) 250 MG capsule Take 1 capsule (250 mg total) by mouth 2 (two) times daily.    . collagenase (SANTYL) ointment Apply topically daily. Apply Santyl to right outer leg wound Q day, then cover with moist fluffed gauze and foam dressing.  (Change foam dressing Q 5 days or PRN soiling.) (Patient not taking: Reported on 07/25/2016)    . feeding supplement (BOOST / RESOURCE BREEZE) LIQD Take 1 Container by mouth 3 (three) times daily between meals. (Patient not taking: Reported on 07/25/2016)    . nystatin (MYCOSTATIN/NYSTOP) powder Apply topically 2 (two) times daily. Apply to inguinal folds (Patient not taking: Reported on 07/25/2016)    . vancomycin (VANCOCIN) 50 mg/mL oral solution Take 2.5 mLs (125 mg total) by  mouth 4 (four) times daily. Discontinue after 07/20/16 doses. (Patient not taking: Reported on 07/25/2016)      Results for orders placed or performed during the hospital encounter of 07/25/16 (from the past 48 hour(s))  CBC with Differential     Status: Abnormal   Collection Time: 07/25/16  4:12 PM  Result Value Ref Range   WBC 16.9 (H) 4.0 - 10.5 K/uL   RBC 4.15 (L) 4.22 - 5.81 MIL/uL   Hemoglobin 12.3 (L) 13.0 - 17.0 g/dL   HCT 39.4 39.0 - 52.0 %   MCV 94.9 78.0 - 100.0 fL   MCH 29.6 26.0 - 34.0 pg   MCHC 31.2 30.0 - 36.0 g/dL   RDW 19.5 (H) 11.5 - 15.5 %   Platelets 202 150 - 400 K/uL   Neutrophils Relative % 88 %   Neutro Abs 14.8 (H) 1.7 - 7.7 K/uL   Lymphocytes Relative 9 %   Lymphs Abs 1.5 0.7 - 4.0 K/uL   Monocytes Relative 3 %   Monocytes Absolute 0.6 0.1 - 1.0 K/uL   Eosinophils Relative 0 %   Eosinophils Absolute 0.0 0.0 - 0.7 K/uL   Basophils Relative 0 %   Basophils Absolute 0.0 0.0 - 0.1 K/uL  Comprehensive metabolic panel     Status: Abnormal   Collection Time: 07/25/16  4:12 PM  Result Value Ref Range   Sodium 136 135 - 145 mmol/L   Potassium 4.3 3.5 - 5.1 mmol/L   Chloride 109 101 - 111 mmol/L   CO2 20 (L) 22 - 32 mmol/L   Glucose, Bld 154 (H) 65 - 99 mg/dL   BUN 28 (H) 6 - 20 mg/dL   Creatinine, Ser 1.35 (H) 0.61 - 1.24 mg/dL   Calcium 8.9 8.9 - 10.3 mg/dL   Total Protein 6.8 6.5 - 8.1 g/dL   Albumin 2.9 (L) 3.5 - 5.0 g/dL   AST 26 15 - 41 U/L   ALT 19 17 - 63 U/L   Alkaline Phosphatase 88 38 - 126 U/L   Total Bilirubin 0.6 0.3 - 1.2 mg/dL   GFR calc non Af Amer 51 (L) >60 mL/min   GFR calc Af Amer 59 (L) >60 mL/min    Comment: (NOTE) The eGFR has been calculated using the CKD EPI equation. This calculation has not been validated in all clinical situations. eGFR's persistently <60 mL/min signify possible Chronic Kidney Disease.    Anion gap 7 5 - 15  Lipase, blood  Status: Abnormal   Collection Time: 07/25/16  4:12 PM  Result Value Ref Range     Lipase 52 (H) 11 - 51 U/L   Dg Chest 1 View  Result Date: 07/25/2016 CLINICAL DATA:  [place] this morning and injured left hip. EXAM: CHEST 1 VIEW COMPARISON:  07/07/2016 FINDINGS: The cardiac silhouette, mediastinal and hilar contours are within normal limits and stable. Moderate tortuosity of the thoracic aorta. Stable emphysematous changes and pulmonary scarring. No definite acute overlying pulmonary process. The bony thorax is intact. IMPRESSION: Emphysematous changes and pulmonary scarring but no acute overlying pulmonary findings. Electronically Signed   By: Marijo Sanes M.D.   On: 07/25/2016 15:56   Ct Head Wo Contrast  Result Date: 07/25/2016 CLINICAL DATA:  Status post fall this morning with left-sided hip pain. Vomiting after fall. EXAM: CT HEAD WITHOUT CONTRAST CT CERVICAL SPINE WITHOUT CONTRAST TECHNIQUE: Multidetector CT imaging of the head and cervical spine was performed following the standard protocol without intravenous contrast. Multiplanar CT image reconstructions of the cervical spine were also generated. COMPARISON:  CT head and cervical spine dated 07/06/2016. FINDINGS: CT HEAD FINDINGS Brain: Again noted is generalized age related parenchymal atrophy with commensurate dilatation of the ventricles and sulci. Again noted are confluent areas of low density within the bilateral periventricular and subcortical white matter regions consistent with changes of chronic small vessel ischemia. Small old lacunar infarcts again noted within the left basal ganglia. There is no mass, hemorrhage, edema or other evidence of acute parenchymal abnormality. No extra-axial hemorrhage. Vascular: There are chronic calcified atherosclerotic changes of the large vessels at the skull base. No unexpected hyperdense vessel. Skull: Normal. Negative for fracture or focal lesion. Sinuses/Orbits: No acute finding. Other: None CT CERVICAL SPINE FINDINGS Alignment: Dextroscoliosis of the lower cervical spine.  Alignment otherwise normal. Skull base and vertebrae: No fracture line or displaced fracture fragment identified. Facet joints appear intact and normally aligned. Soft tissues and spinal canal: No prevertebral fluid or swelling. No visible canal hematoma. Disc levels: Mild degenerative change without significant central canal stenosis. Upper chest: Severe emphysematous changes within each lung apex. Other: None IMPRESSION: 1. No acute intracranial abnormality. No intracranial mass, hemorrhage or edema. No skull fracture. Atrophy and chronic ischemic changes, as detailed above. 2. No fracture or acute subluxation within the cervical spine. Scoliosis and mild degenerative change. 3. Severe emphysematous changes within the lung apices. Electronically Signed   By: Franki Cabot M.D.   On: 07/25/2016 16:16   Ct Cervical Spine Wo Contrast  Result Date: 07/25/2016 CLINICAL DATA:  Status post fall this morning with left-sided hip pain. Vomiting after fall. EXAM: CT HEAD WITHOUT CONTRAST CT CERVICAL SPINE WITHOUT CONTRAST TECHNIQUE: Multidetector CT imaging of the head and cervical spine was performed following the standard protocol without intravenous contrast. Multiplanar CT image reconstructions of the cervical spine were also generated. COMPARISON:  CT head and cervical spine dated 07/06/2016. FINDINGS: CT HEAD FINDINGS Brain: Again noted is generalized age related parenchymal atrophy with commensurate dilatation of the ventricles and sulci. Again noted are confluent areas of low density within the bilateral periventricular and subcortical white matter regions consistent with changes of chronic small vessel ischemia. Small old lacunar infarcts again noted within the left basal ganglia. There is no mass, hemorrhage, edema or other evidence of acute parenchymal abnormality. No extra-axial hemorrhage. Vascular: There are chronic calcified atherosclerotic changes of the large vessels at the skull base. No unexpected  hyperdense vessel. Skull: Normal. Negative for fracture or focal  lesion. Sinuses/Orbits: No acute finding. Other: None CT CERVICAL SPINE FINDINGS Alignment: Dextroscoliosis of the lower cervical spine. Alignment otherwise normal. Skull base and vertebrae: No fracture line or displaced fracture fragment identified. Facet joints appear intact and normally aligned. Soft tissues and spinal canal: No prevertebral fluid or swelling. No visible canal hematoma. Disc levels: Mild degenerative change without significant central canal stenosis. Upper chest: Severe emphysematous changes within each lung apex. Other: None IMPRESSION: 1. No acute intracranial abnormality. No intracranial mass, hemorrhage or edema. No skull fracture. Atrophy and chronic ischemic changes, as detailed above. 2. No fracture or acute subluxation within the cervical spine. Scoliosis and mild degenerative change. 3. Severe emphysematous changes within the lung apices. Electronically Signed   By: Franki Cabot M.D.   On: 07/25/2016 16:16   Dg Knee Complete 4 Views Left  Result Date: 07/25/2016 CLINICAL DATA:  Acute onset of generalized left knee pain after fall. Initial encounter. EXAM: LEFT KNEE - COMPLETE 4+ VIEW COMPARISON:  None. FINDINGS: There is no evidence of fracture or dislocation. The joint spaces are preserved. No significant degenerative change is seen; the patellofemoral joint is grossly unremarkable in appearance. Trace knee joint fluid is within normal limits. A small enthesophyte is noted at the upper pole of the patella. Scattered vascular calcifications are seen. IMPRESSION: 1. No evidence of fracture or dislocation. 2. Scattered vascular calcifications seen. Electronically Signed   By: Garald Balding M.D.   On: 07/25/2016 18:29   Dg Hip Unilat W Or Wo Pelvis 2-3 Views Left  Result Date: 07/25/2016 CLINICAL DATA:  Fall this morning.  Left hip pain. EXAM: DG HIP (WITH OR WITHOUT PELVIS) 2-3V LEFT COMPARISON:  None. FINDINGS:  Fracture of the proximal left femur with superior displacement of the distal component. This fracture is at the left femoral head and neck junction and compatible with a subcapital fracture. Prostate calcifications at the level of the pubic symphysis. Normal bowel gas pattern. Pubic rami are intact. Left femoral head is located. Right hip is grossly intact. IMPRESSION: Subcapital fracture in the left femur. Electronically Signed   By: Markus Daft M.D.   On: 07/25/2016 15:57    ROS: ROS  Currently lives in nursing facility Unable to ambulate currently  Physical Exam: Awake and appropriate 70 y/o male answering questions appropriately Cervical spine with full rom, no tenderness Bilateral upper extremities with full rom, no tenderness or deformity Left lower extremity with shortening and mild internal rotation nv intact distally Right lower extremity, small abrasion to anterior knee otherwise negative No signs of open injury or other deformity  Physical Exam   Assessment/Plan Assessment: left femur fracture  Plan: Pt to be admitted by hospital staff and plan for surgery for the left hip once cleared medically, possibly as early as tomorrow NPO after midnight Pain management as needed Strict bedrest currently       ADDENDUM: patient seen and examined. Proceed with surgery tonight if medically cleared

## 2016-07-25 NOTE — ED Notes (Signed)
Patient loudly refused in & out cath. Bedding & gown had been urinated on. RN & EMT changed gown & bedding. Condom cath applied by EMT.

## 2016-07-25 NOTE — ED Notes (Signed)
[facility] contacted for further information about seizure like activity at time of fall. Staff that witnessed event left AT 1500 and no information in nursing home chart. Dr. Ranae Palms informed.

## 2016-07-25 NOTE — ED Notes (Signed)
Called [facility] [place] again. Patient no longer assigned bed on [facility] [place].

## 2016-07-25 NOTE — ED Notes (Addendum)
Attempted report to [facility] x 1.

## 2016-07-25 NOTE — ED Notes (Signed)
Attempted to call report to [facility] x 1. 

## 2016-07-26 ENCOUNTER — Inpatient Hospital Stay (HOSPITAL_COMMUNITY): Payer: Medicare HMO | Admitting: Certified Registered"

## 2016-07-26 ENCOUNTER — Encounter (HOSPITAL_COMMUNITY): Payer: Self-pay | Admitting: Surgery

## 2016-07-26 ENCOUNTER — Inpatient Hospital Stay (HOSPITAL_COMMUNITY): Payer: Medicare HMO

## 2016-07-26 ENCOUNTER — Encounter (HOSPITAL_COMMUNITY)
Admission: EM | Disposition: A | Discharge status: Skilled Nursing Facility | Payer: Self-pay | Source: Home / Self Care | Attending: Internal Medicine

## 2016-07-26 DIAGNOSIS — W19XXXA Unspecified fall, initial encounter: Secondary | ICD-10-CM

## 2016-07-26 DIAGNOSIS — S72002A Fracture of unspecified part of neck of left femur, initial encounter for closed fracture: Secondary | ICD-10-CM | POA: Diagnosis present

## 2016-07-26 DIAGNOSIS — M05711 Rheumatoid arthritis with rheumatoid factor of right shoulder without organ or systems involvement: Secondary | ICD-10-CM

## 2016-07-26 DIAGNOSIS — F0390 Unspecified dementia without behavioral disturbance: Secondary | ICD-10-CM

## 2016-07-26 HISTORY — PX: ANTERIOR APPROACH HEMI HIP ARTHROPLASTY: SHX6690

## 2016-07-26 LAB — CBC
HCT: 36.2 % — ABNORMAL LOW (ref 39.0–52.0)
HEMOGLOBIN: 11.5 g/dL — AB (ref 13.0–17.0)
MCH: 29.9 pg (ref 26.0–34.0)
MCHC: 31.8 g/dL (ref 30.0–36.0)
MCV: 94 fL (ref 78.0–100.0)
Platelets: 164 10*3/uL (ref 150–400)
RBC: 3.85 MIL/uL — ABNORMAL LOW (ref 4.22–5.81)
RDW: 19.3 % — ABNORMAL HIGH (ref 11.5–15.5)
WBC: 15.7 10*3/uL — ABNORMAL HIGH (ref 4.0–10.5)

## 2016-07-26 LAB — BASIC METABOLIC PANEL
Anion gap: 8 (ref 5–15)
BUN: 24 mg/dL — AB (ref 6–20)
CHLORIDE: 109 mmol/L (ref 101–111)
CO2: 22 mmol/L (ref 22–32)
CREATININE: 1.26 mg/dL — AB (ref 0.61–1.24)
Calcium: 8.8 mg/dL — ABNORMAL LOW (ref 8.9–10.3)
GFR calc Af Amer: >60 mL/min (ref >60)
GFR calc non Af Amer: 56 mL/min — ABNORMAL LOW (ref >60)
Glucose, Bld: 118 mg/dL — ABNORMAL HIGH (ref 65–99)
Potassium: 5.5 mmol/L — ABNORMAL HIGH (ref 3.5–5.1)
SODIUM: 139 mmol/L (ref 135–145)

## 2016-07-26 LAB — MAGNESIUM: Magnesium: 1.7 mg/dL (ref 1.7–2.4)

## 2016-07-26 LAB — ABO/RH: ABO/RH(D): A POS

## 2016-07-26 LAB — MRSA PCR SCREENING: MRSA by PCR: NEGATIVE

## 2016-07-26 SURGERY — HEMIARTHROPLASTY, HIP, DIRECT ANTERIOR APPROACH, FOR FRACTURE
Anesthesia: Monitor Anesthesia Care | Site: Hip | Laterality: Left

## 2016-07-26 MED ORDER — SODIUM CHLORIDE 0.9 % IJ SOLN
INTRAMUSCULAR | Status: AC
  Filled 2016-07-26: qty 10

## 2016-07-26 MED ORDER — OXYCODONE HCL 5 MG PO TABS: 5.0000 mg | ORAL_TABLET | Freq: Once | ORAL | Status: DC | PRN

## 2016-07-26 MED ORDER — ACETAMINOPHEN 325 MG PO TABS: 650.0000 mg | ORAL_TABLET | Freq: Four times a day (QID) | ORAL | Status: DC | PRN

## 2016-07-26 MED ORDER — ONDANSETRON HCL 4 MG/2ML IJ SOLN
INTRAMUSCULAR | Status: DC | PRN
  Administered 2016-07-26: 4 mg via INTRAVENOUS

## 2016-07-26 MED ORDER — PHENOL 1.4 % MT LIQD: 1.0000 | OROMUCOSAL | Status: DC | PRN

## 2016-07-26 MED ORDER — LACTATED RINGERS IV SOLN
INTRAVENOUS | Status: DC | PRN
  Administered 2016-07-26 (×3): via INTRAVENOUS

## 2016-07-26 MED ORDER — SODIUM CHLORIDE 0.9 % IV SOLN
1000.0000 mg | INTRAVENOUS | Status: AC
  Administered 2016-07-26: 1000 mg via INTRAVENOUS
  Filled 2016-07-26: qty 10

## 2016-07-26 MED ORDER — DIPHENHYDRAMINE HCL 50 MG/ML IJ SOLN
INTRAMUSCULAR | Status: AC
  Filled 2016-07-26: qty 1

## 2016-07-26 MED ORDER — LIDOCAINE 2% (20 MG/ML) 5 ML SYRINGE
INTRAMUSCULAR | Status: AC
  Filled 2016-07-26: qty 5

## 2016-07-26 MED ORDER — PROPOFOL 10 MG/ML IV BOLUS
INTRAVENOUS | Status: AC
  Filled 2016-07-26: qty 40

## 2016-07-26 MED ORDER — PHENYLEPHRINE HCL 10 MG/ML IJ SOLN
INTRAMUSCULAR | Status: DC | PRN
  Administered 2016-07-26 (×5): 40 ug via INTRAVENOUS

## 2016-07-26 MED ORDER — ACETAMINOPHEN 10 MG/ML IV SOLN
INTRAVENOUS | Status: DC | PRN
  Administered 2016-07-26: 1000 mg via INTRAVENOUS

## 2016-07-26 MED ORDER — MAGNESIUM SULFATE IN D5W 1-5 GM/100ML-% IV SOLN
1.0000 g | Freq: Once | INTRAVENOUS | Status: AC
  Administered 2016-07-26: 1 g via INTRAVENOUS
  Filled 2016-07-26: qty 100

## 2016-07-26 MED ORDER — FENTANYL CITRATE (PF) 100 MCG/2ML IJ SOLN
INTRAMUSCULAR | Status: AC
  Administered 2016-07-26: 50 ug via INTRAVENOUS
  Filled 2016-07-26: qty 2

## 2016-07-26 MED ORDER — HYDROCODONE-ACETAMINOPHEN 5-325 MG PO TABS
1.0000 | ORAL_TABLET | Freq: Four times a day (QID) | ORAL | Status: DC | PRN
  Administered 2016-07-26 – 2016-07-28 (×5): 2 via ORAL
  Filled 2016-07-26 (×5): qty 2

## 2016-07-26 MED ORDER — PHENYLEPHRINE HCL 10 MG/ML IJ SOLN
INTRAMUSCULAR | Status: DC | PRN
  Administered 2016-07-26: 20 ug/min via INTRAVENOUS

## 2016-07-26 MED ORDER — ONDANSETRON HCL 4 MG PO TABS: 4.0000 mg | ORAL_TABLET | Freq: Four times a day (QID) | ORAL | Status: DC | PRN

## 2016-07-26 MED ORDER — CHLORHEXIDINE GLUCONATE 4 % EX LIQD
60.0000 mL | Freq: Once | CUTANEOUS | Status: AC
  Administered 2016-07-26: 4 via TOPICAL

## 2016-07-26 MED ORDER — ACETAMINOPHEN 650 MG RE SUPP: 650.0000 mg | Freq: Four times a day (QID) | RECTAL | Status: DC | PRN

## 2016-07-26 MED ORDER — OXYCODONE HCL 5 MG/5ML PO SOLN: 5.0000 mg | Freq: Once | ORAL | Status: DC | PRN

## 2016-07-26 MED ORDER — CEFAZOLIN SODIUM-DEXTROSE 2-4 GM/100ML-% IV SOLN
2.0000 g | INTRAVENOUS | Status: AC
  Administered 2016-07-26: 2 g via INTRAVENOUS
  Filled 2016-07-26 (×2): qty 100

## 2016-07-26 MED ORDER — BUPIVACAINE HCL (PF) 0.5 % IJ SOLN
INTRAMUSCULAR | Status: AC
  Filled 2016-07-26: qty 30

## 2016-07-26 MED ORDER — PROPOFOL 1000 MG/100ML IV EMUL
INTRAVENOUS | Status: AC
  Filled 2016-07-26: qty 100

## 2016-07-26 MED ORDER — ACETAMINOPHEN 10 MG/ML IV SOLN
INTRAVENOUS | Status: AC
  Filled 2016-07-26: qty 100

## 2016-07-26 MED ORDER — ROCURONIUM BROMIDE 10 MG/ML (PF) SYRINGE
PREFILLED_SYRINGE | INTRAVENOUS | Status: AC
  Filled 2016-07-26: qty 10

## 2016-07-26 MED ORDER — SODIUM CHLORIDE 0.9 % IJ SOLN
INTRAMUSCULAR | Status: DC | PRN
  Administered 2016-07-26: 30 mL via INTRAVENOUS

## 2016-07-26 MED ORDER — POLYETHYLENE GLYCOL 3350 17 G PO PACK: 17.0000 g | PACK | Freq: Every day | ORAL | Status: DC | PRN

## 2016-07-26 MED ORDER — POVIDONE-IODINE 10 % EX SWAB: 2.0000 "application " | Freq: Once | CUTANEOUS | Status: DC

## 2016-07-26 MED ORDER — ONDANSETRON HCL 4 MG/2ML IJ SOLN
4.0000 mg | Freq: Once | INTRAMUSCULAR | Status: DC | PRN
  Filled 2016-07-26: qty 2

## 2016-07-26 MED ORDER — SUGAMMADEX SODIUM 200 MG/2ML IV SOLN
INTRAVENOUS | Status: AC
  Filled 2016-07-26: qty 2

## 2016-07-26 MED ORDER — ENOXAPARIN SODIUM 40 MG/0.4ML ~~LOC~~ SOLN: 40.0000 mg | SUBCUTANEOUS | Status: DC

## 2016-07-26 MED ORDER — SUFENTANIL CITRATE 50 MCG/ML IV SOLN
INTRAVENOUS | Status: AC
  Filled 2016-07-26: qty 1

## 2016-07-26 MED ORDER — HYDROMORPHONE HCL 1 MG/ML IJ SOLN: 0.2500 mg | INTRAMUSCULAR | Status: DC | PRN

## 2016-07-26 MED ORDER — KETOROLAC TROMETHAMINE 30 MG/ML IJ SOLN
INTRAMUSCULAR | Status: DC | PRN
  Administered 2016-07-26: 30 mg via INTRAVENOUS

## 2016-07-26 MED ORDER — SUFENTANIL CITRATE 50 MCG/ML IV SOLN
INTRAVENOUS | Status: DC | PRN
  Administered 2016-07-26 (×2): 5 ug via INTRAVENOUS

## 2016-07-26 MED ORDER — 0.9 % SODIUM CHLORIDE (POUR BTL) OPTIME
TOPICAL | Status: DC | PRN
  Administered 2016-07-26: 1000 mL

## 2016-07-26 MED ORDER — PROPOFOL 10 MG/ML IV BOLUS
INTRAVENOUS | Status: DC | PRN
  Administered 2016-07-26: 30 mg via INTRAVENOUS
  Administered 2016-07-26: 25 mg via INTRAVENOUS
  Administered 2016-07-26: 125 mg via INTRAVENOUS
  Administered 2016-07-26: 20 mg via INTRAVENOUS

## 2016-07-26 MED ORDER — TETRACAINE HCL 1 % IJ SOLN
INTRAMUSCULAR | Status: AC
  Filled 2016-07-26: qty 2

## 2016-07-26 MED ORDER — METHOCARBAMOL 500 MG PO TABS
500.0000 mg | ORAL_TABLET | Freq: Four times a day (QID) | ORAL | Status: DC | PRN
  Administered 2016-07-26 – 2016-07-28 (×3): 500 mg via ORAL
  Filled 2016-07-26 (×3): qty 1

## 2016-07-26 MED ORDER — CEFAZOLIN SODIUM-DEXTROSE 2-4 GM/100ML-% IV SOLN
2.0000 g | Freq: Four times a day (QID) | INTRAVENOUS | Status: AC
  Administered 2016-07-27 (×2): 2 g via INTRAVENOUS
  Filled 2016-07-26 (×2): qty 100

## 2016-07-26 MED ORDER — MORPHINE SULFATE (PF) 2 MG/ML IV SOLN
0.5000 mg | INTRAVENOUS | Status: DC | PRN
  Administered 2016-07-27: 0.5 mg via INTRAVENOUS
  Filled 2016-07-26: qty 1

## 2016-07-26 MED ORDER — DIAZEPAM 5 MG/ML IJ SOLN
2.5000 mg | Freq: Once | INTRAMUSCULAR | Status: AC
  Administered 2016-07-26: 2.5 mg via INTRAVENOUS
  Filled 2016-07-26: qty 2

## 2016-07-26 MED ORDER — METHOCARBAMOL 1000 MG/10ML IJ SOLN
500.0000 mg | Freq: Once | INTRAVENOUS | Status: AC
  Administered 2016-07-26: 500 mg via INTRAVENOUS
  Filled 2016-07-26: qty 5

## 2016-07-26 MED ORDER — MORPHINE SULFATE (PF) 2 MG/ML IV SOLN
2.0000 mg | INTRAVENOUS | Status: DC | PRN
  Administered 2016-07-26 (×2): 2 mg via INTRAVENOUS
  Filled 2016-07-26 (×2): qty 1

## 2016-07-26 MED ORDER — METOCLOPRAMIDE HCL 5 MG PO TABS: 5.0000 mg | ORAL_TABLET | Freq: Three times a day (TID) | ORAL | Status: DC | PRN

## 2016-07-26 MED ORDER — MENTHOL 3 MG MT LOZG: 1.0000 | LOZENGE | OROMUCOSAL | Status: DC | PRN

## 2016-07-26 MED ORDER — METOCLOPRAMIDE HCL 5 MG/ML IJ SOLN
5.0000 mg | Freq: Three times a day (TID) | INTRAMUSCULAR | Status: DC | PRN
  Administered 2016-07-27: 10 mg via INTRAVENOUS
  Filled 2016-07-26: qty 2

## 2016-07-26 MED ORDER — SUCCINYLCHOLINE CHLORIDE 20 MG/ML IJ SOLN
INTRAMUSCULAR | Status: DC | PRN
  Administered 2016-07-26: 120 mg via INTRAVENOUS

## 2016-07-26 MED ORDER — LACTATED RINGERS IV SOLN
INTRAVENOUS | Status: DC
  Administered 2016-07-26: 17:00:00 via INTRAVENOUS

## 2016-07-26 MED ORDER — SUGAMMADEX SODIUM 200 MG/2ML IV SOLN
INTRAVENOUS | Status: DC | PRN
  Administered 2016-07-26: 200 mg via INTRAVENOUS

## 2016-07-26 MED ORDER — FENTANYL CITRATE (PF) 100 MCG/2ML IJ SOLN
50.0000 ug | Freq: Once | INTRAMUSCULAR | Status: AC
  Administered 2016-07-26: 50 ug via INTRAVENOUS
  Filled 2016-07-26: qty 1

## 2016-07-26 MED ORDER — PROCHLORPERAZINE MALEATE 5 MG PO TABS
5.0000 mg | ORAL_TABLET | Freq: Four times a day (QID) | ORAL | Status: DC | PRN
  Filled 2016-07-26: qty 1

## 2016-07-26 MED ORDER — ONDANSETRON HCL 4 MG/2ML IJ SOLN
4.0000 mg | Freq: Four times a day (QID) | INTRAMUSCULAR | Status: DC | PRN
  Administered 2016-07-26: 4 mg via INTRAVENOUS

## 2016-07-26 MED ORDER — METHOCARBAMOL 1000 MG/10ML IJ SOLN
500.0000 mg | Freq: Three times a day (TID) | INTRAMUSCULAR | Status: DC | PRN
  Filled 2016-07-26: qty 5

## 2016-07-26 MED ORDER — MIDAZOLAM HCL 2 MG/2ML IJ SOLN
INTRAMUSCULAR | Status: AC
  Filled 2016-07-26: qty 2

## 2016-07-26 MED ORDER — PHENYLEPHRINE 40 MCG/ML (10ML) SYRINGE FOR IV PUSH (FOR BLOOD PRESSURE SUPPORT)
PREFILLED_SYRINGE | INTRAVENOUS | Status: AC
  Filled 2016-07-26: qty 10

## 2016-07-26 MED ORDER — ONDANSETRON HCL 4 MG/2ML IJ SOLN
INTRAMUSCULAR | Status: AC
  Filled 2016-07-26: qty 2

## 2016-07-26 MED ORDER — ROCURONIUM BROMIDE 100 MG/10ML IV SOLN
INTRAVENOUS | Status: DC | PRN
  Administered 2016-07-26: 30 mg via INTRAVENOUS

## 2016-07-26 MED ORDER — BUPIVACAINE-EPINEPHRINE (PF) 0.5% -1:200000 IJ SOLN
INTRAMUSCULAR | Status: DC | PRN
  Administered 2016-07-26: 30 mL via PERINEURAL

## 2016-07-26 MED ORDER — PROPOFOL 500 MG/50ML IV EMUL
INTRAVENOUS | Status: DC | PRN
  Administered 2016-07-26: 25 ug/kg/min via INTRAVENOUS

## 2016-07-26 MED ORDER — SODIUM CHLORIDE 0.9 % IR SOLN
Status: DC | PRN
  Administered 2016-07-26: 1

## 2016-07-26 MED ORDER — METHOCARBAMOL 1000 MG/10ML IJ SOLN: 500.0000 mg | Freq: Four times a day (QID) | INTRAVENOUS | Status: DC | PRN

## 2016-07-26 MED ORDER — SUCCINYLCHOLINE CHLORIDE 200 MG/10ML IV SOSY
PREFILLED_SYRINGE | INTRAVENOUS | Status: AC
  Filled 2016-07-26: qty 10

## 2016-07-26 MED ORDER — ENOXAPARIN SODIUM 40 MG/0.4ML ~~LOC~~ SOLN
40.0000 mg | SUBCUTANEOUS | Status: DC
  Administered 2016-07-27 – 2016-07-28 (×2): 40 mg via SUBCUTANEOUS
  Filled 2016-07-26 (×2): qty 0.4

## 2016-07-26 SURGICAL SUPPLY — 54 items
BLADE SAW SGTL 18X1.27X75 (BLADE) IMPLANT
BLADE SAW SGTL 18X1.27X75MM (BLADE)
BLADE SURG ROTATE 9660 (MISCELLANEOUS) IMPLANT
CAPT HIP HEMI 2 ×3 IMPLANT
CHLORAPREP W/TINT 26ML (MISCELLANEOUS) ×6 IMPLANT
COVER SURGICAL LIGHT HANDLE (MISCELLANEOUS) ×6 IMPLANT
DERMABOND ADVANCED (GAUZE/BANDAGES/DRESSINGS) ×4
DERMABOND ADVANCED .7 DNX12 (GAUZE/BANDAGES/DRESSINGS) ×2 IMPLANT
DRAPE C-ARM 42X72 X-RAY (DRAPES) ×3 IMPLANT
DRAPE IMP U-DRAPE 54X76 (DRAPES) ×6 IMPLANT
DRAPE STERI IOBAN 125X83 (DRAPES) ×6 IMPLANT
DRAPE U-SHAPE 47X51 STRL (DRAPES) ×9 IMPLANT
DRSG AQUACEL AG ADV 3.5X10 (GAUZE/BANDAGES/DRESSINGS) ×3 IMPLANT
ELECT BLADE 4.0 EZ CLEAN MEGAD (MISCELLANEOUS) ×3
ELECT REM PT RETURN 9FT ADLT (ELECTROSURGICAL) ×3
ELECTRODE BLDE 4.0 EZ CLN MEGD (MISCELLANEOUS) ×1 IMPLANT
ELECTRODE REM PT RTRN 9FT ADLT (ELECTROSURGICAL) ×1 IMPLANT
EVACUATOR 1/8 PVC DRAIN (DRAIN) IMPLANT
GLOVE BIO SURGEON STRL SZ8.5 (GLOVE) ×6 IMPLANT
GLOVE BIOGEL PI IND STRL 8.5 (GLOVE) ×1 IMPLANT
GLOVE BIOGEL PI INDICATOR 8.5 (GLOVE) ×2
GOWN STRL REUS W/ TWL LRG LVL3 (GOWN DISPOSABLE) ×2 IMPLANT
GOWN STRL REUS W/TWL 2XL LVL3 (GOWN DISPOSABLE) ×3 IMPLANT
GOWN STRL REUS W/TWL LRG LVL3 (GOWN DISPOSABLE) ×4
HANDPIECE INTERPULSE COAX TIP (DISPOSABLE) ×2
HOOD PEEL AWAY FACE SHEILD DIS (HOOD) ×6 IMPLANT
HOOD PEEL AWAY FLYTE STAYCOOL (MISCELLANEOUS) ×3 IMPLANT
KIT BASIN OR (CUSTOM PROCEDURE TRAY) ×3 IMPLANT
KIT ROOM TURNOVER OR (KITS) ×3 IMPLANT
MANIFOLD NEPTUNE II (INSTRUMENTS) ×3 IMPLANT
MARKER SKIN DUAL TIP RULER LAB (MISCELLANEOUS) ×3 IMPLANT
NEEDLE SPNL 18GX3.5 QUINCKE PK (NEEDLE) ×3 IMPLANT
NS IRRIG 1000ML POUR BTL (IV SOLUTION) ×3 IMPLANT
PACK TOTAL JOINT (CUSTOM PROCEDURE TRAY) ×3 IMPLANT
PACK UNIVERSAL I (CUSTOM PROCEDURE TRAY) ×3 IMPLANT
PAD ARMBOARD 7.5X6 YLW CONV (MISCELLANEOUS) ×6 IMPLANT
SAW OSC TIP CART 19.5X105X1.3 (SAW) ×3 IMPLANT
SEALER BIPOLAR AQUA 6.0 (INSTRUMENTS) IMPLANT
SET HNDPC FAN SPRY TIP SCT (DISPOSABLE) ×1 IMPLANT
SUCTION FRAZIER HANDLE 10FR (MISCELLANEOUS) ×2
SUCTION TUBE FRAZIER 10FR DISP (MISCELLANEOUS) ×1 IMPLANT
SUT ETHIBOND NAB CT1 #1 30IN (SUTURE) ×6 IMPLANT
SUT MNCRL AB 3-0 PS2 18 (SUTURE) ×3 IMPLANT
SUT MON AB 2-0 CT1 36 (SUTURE) ×3 IMPLANT
SUT VIC AB 1 CT1 27 (SUTURE) ×2
SUT VIC AB 1 CT1 27XBRD ANBCTR (SUTURE) ×1 IMPLANT
SUT VIC AB 2-0 CT1 27 (SUTURE) ×2
SUT VIC AB 2-0 CT1 TAPERPNT 27 (SUTURE) ×1 IMPLANT
SUT VLOC 180 0 24IN GS25 (SUTURE) ×3 IMPLANT
SYR 50ML LL SCALE MARK (SYRINGE) ×3 IMPLANT
TOWEL OR 17X24 6PK STRL BLUE (TOWEL DISPOSABLE) ×3 IMPLANT
TOWEL OR 17X26 10 PK STRL BLUE (TOWEL DISPOSABLE) ×3 IMPLANT
TRAY FOLEY CATH 16FR SILVER (SET/KITS/TRAYS/PACK) ×3 IMPLANT
WATER STERILE IRR 1000ML POUR (IV SOLUTION) ×9 IMPLANT

## 2016-07-26 NOTE — Progress Notes (Signed)
[facility] PROGRESS NOTE  Paul Hansen OJJ:[id number] DOB: 06-27-1946 DOA: 07/25/2016   PCP: Dan Maker, MD     Assessment/Plan: Principal Problem:   Subcapital fracture of femur (HCC) Active Problems:   Fall   Dementia   RA (rheumatoid arthritis) (HCC)   Decubitus ulcer of sacral region, stage 1   70 y.o. male with medical  history of RA on methotrexate and prednisone, vascular dementia, bipolar disorder, prior CVA, and HTN who presents after falling this morning at the skilled nursing facility with complaints of left hip pain. Imaging studies revealed a left subcapital femur fracture.   Assessment and plan  Recurrent Fall with left subcapital femur fracture: Recently discharged on 07/12/16 to SNF Acute. Risk factors include chronic steroid use. Telemetry uneventful, EKG within normal limits - Hip fracture order set initiated - pain control prn - Dr. Winona Legato consulted in the ED, f/u with orthopedics   I do not see any obvious contraindications to surgery, although this would be a moderate risk procedure given other comorbidities such as prior history of CVA, age, hypertension Currently NPO, DVT prophylaxis to be initiated after surgery    anemia of chronic disease-baseline around 12.5, currently stable Thrombocytopenia has resolved  Leukocytosis WBC 16.9 on admission. The differential includes infection(patient just recently treated for C. difficile infection during last hospitalization) versus chronic steroids versus reactive.UA, chest x-ray negative check CBC with differential in a.m.     Chronic kidney disease stage III: Creatinine near baseline, but patient noted to have elevation in BUN to 28 suggesting possible signs of dehydration. - Gentle IV fluids  History of dementia and bipolar disorder - Continuing home medications of Aricept and Seroquel (reduced to 25 mg QHS, QTC 500> msec). Check magnesium. Recheck EKG tomorrow to monitor QTC. Patient  does not have capacity to make medical decisions. As per RN report, patient is ward of the state. No agitation. Will consult social work   COPD without acute exacerbation  - continue Breo - Duonebs prn SOB/Wheezing  Essential hypertension  - continue metoprolol   Elevated lipase: Mild elevation to 52. No significant abdominal pain noted on physical exam -  Continue to monitor Tobacco abuse - continue nicotine patch  Rheumatoid arthritis Holding methotrexate. Continue prednisone. Patient's medications need to be verified with his PCP and if on methotrexate prior to admission, may be resumed in a week's time.  BPH - continue finasteride  Right leg wound - Continue topical wound care as per wound care recommendations made today.  DVT prophylaxsis   Code Status:  Full code     Family Communication: Discussed in detail with the patient, all imaging results, lab results explained to the patient   Disposition Plan:  anticipated surgery today       Consultants: Orthopedics  Procedures  none  Antibiotics   Start     Dose/Rate Route Frequency Ordered Stop   07/25/16 2145  cefTRIAXone (ROCEPHIN) 1 g in dextrose 5 % 50 mL IVPB  Status:  Discontinued     1 g 100 mL/hr over 30 Minutes Intravenous Every 24 hours 07/25/16 2138 07/25/16 2139         HPI/Subjective: Minimal pain in right leg  Objective: Vitals:   07/25/16 2115 07/25/16 2130 07/25/16 2303 07/26/16 0542  BP: 115/84 117/95 (!) 149/88 107/63  Pulse: 97 62 78 (!) 59  Resp: 25 25 (!) 22 18  Temp:   98.2 F (36.8 C) 97.9 F (36.6 C)  TempSrc:   Oral Oral  SpO2:  93% 91% 94%  Weight:   59.9 kg (132 lb 0.9 oz)   Height:        Intake/Output Summary (Last 24 hours) at 07/26/16 0802 Last data filed at 07/26/16 0600  Gross per 24 hour  Intake              950 ml  Output              475 ml  Net              475 ml    Exam:  Examination:  General exam: Appears calm and comfortable   Respiratory system: Clear to auscultation. Respiratory effort normal. Cardiovascular system: S1 & S2 heard, RRR. No JVD, murmurs, rubs, gallops or clicks. No pedal edema. Gastrointestinal system: Abdomen is nondistended, soft and nontender. No organomegaly or masses felt. Normal bowel sounds heard. Central nervous systeconfused at baseline No focal neurological deficits. Extremities: Symmetric 5 x 5 power. Skin: No rashes, lesions or ulcers Psychiatry: Judgement and insight appear normal. Mood & affect appropriate.     Data Reviewed: I have personally reviewed following labs and imaging studies  Micro Results Recent Results (from the past 240 hour(s))  MRSA PCR Screening     Status: None   Collection Time: 07/25/16 11:32 PM  Result Value Ref Range Status   MRSA by PCR NEGATIVE NEGATIVE Final    Comment:        The GeneXpert MRSA Assay (FDA approved for NASAL specimens only), is one component of a comprehensive MRSA colonization surveillance program. It is not intended to diagnose MRSA infection nor to guide or monitor treatment for MRSA infections.     Radiology Reports Dg Chest 1 View  Result Date: 07/25/2016 CLINICAL DATA:  Larey Seat this morning and injured left hip. EXAM: CHEST 1 VIEW COMPARISON:  07/07/2016 FINDINGS: The cardiac silhouette, mediastinal and hilar contours are within normal limits and stable. Moderate tortuosity of the thoracic aorta. Stable emphysematous changes and pulmonary scarring. No definite acute overlying pulmonary process. The bony thorax is intact. IMPRESSION: Emphysematous changes and pulmonary scarring but no acute overlying pulmonary findings. Electronically Signed   By: Rudie Meyer M.D.   On: 07/25/2016 15:56   Dg Forearm Right  Result Date: 07/06/2016 CLINICAL DATA:  Larey Seat today.  Right forearm pain. EXAM: RIGHT FOREARM - 2 VIEW COMPARISON:  None. FINDINGS: The wrist and elbow joints are maintained. No acute forearm fracture. IMPRESSION: No acute  fracture. Electronically Signed   By: Rudie Meyer M.D.   On: 07/06/2016 13:19   Ct Head Wo Contrast  Result Date: 07/25/2016 CLINICAL DATA:  Status post fall this morning with left-sided hip pain. Vomiting after fall. EXAM: CT HEAD WITHOUT CONTRAST CT CERVICAL SPINE WITHOUT CONTRAST TECHNIQUE: Multidetector CT imaging of the head and cervical spine was performed following the standard protocol without intravenous contrast. Multiplanar CT image reconstructions of the cervical spine were also generated. COMPARISON:  CT head and cervical spine dated 07/06/2016. FINDINGS: CT HEAD FINDINGS Brain: Again noted is generalized age related parenchymal atrophy with commensurate dilatation of the ventricles and sulci. Again noted are confluent areas of low density within the bilateral periventricular and subcortical white matter regions consistent with changes of chronic small vessel ischemia. Small old lacunar infarcts again noted within the left basal ganglia. There is no mass, hemorrhage, edema or other evidence of acute parenchymal abnormality. No extra-axial hemorrhage. Vascular: There are chronic calcified atherosclerotic changes of  the large vessels at the skull base. No unexpected hyperdense vessel. Skull: Normal. Negative for fracture or focal lesion. Sinuses/Orbits: No acute finding. Other: None CT CERVICAL SPINE FINDINGS Alignment: Dextroscoliosis of the lower cervical spine. Alignment otherwise normal. Skull base and vertebrae: No fracture line or displaced fracture fragment identified. Facet joints appear intact and normally aligned. Soft tissues and spinal canal: No prevertebral fluid or swelling. No visible canal hematoma. Disc levels: Mild degenerative change without significant central canal stenosis. Upper chest: Severe emphysematous changes within each lung apex. Other: None IMPRESSION: 1. No acute intracranial abnormality. No intracranial mass, hemorrhage or edema. No skull fracture. Atrophy and  chronic ischemic changes, as detailed above. 2. No fracture or acute subluxation within the cervical spine. Scoliosis and mild degenerative change. 3. Severe emphysematous changes within the lung apices. Electronically Signed   By: Bary RichardStan  Maynard M.D.   On: 07/25/2016 16:16   Ct Head Wo Contrast  Result Date: 07/06/2016 CLINICAL DATA:  Fall last night. Laceration above the right eye. Initial encounter. EXAM: CT HEAD WITHOUT CONTRAST CT CERVICAL SPINE WITHOUT CONTRAST TECHNIQUE: Multidetector CT imaging of the head and cervical spine was performed following the standard protocol without intravenous contrast. Multiplanar CT image reconstructions of the cervical spine were also generated. COMPARISON:  Brain MRI 12/03/2009. FINDINGS: CT HEAD FINDINGS Brain: There is moderate cerebral atrophy with greatest involvement of the parietal lobes. 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 There is no evidence of acute cortical infarct, intracranial hemorrhage, mass, midline shift, or extra-axial fluid collection. Patchy to confluent cerebral white matter hypodensities are nonspecific but compatible with moderate chronic small vessel ischemic disease, advanced for age. Vascular: Calcified atherosclerosis at the skullbase. Skull: No fracture identified. Sinuses/Orbits: Visualized paranasal sinuses and mastoid air cells are clear. Orbits are unremarkable. Other: Mild supraorbital soft tissue swelling. CT CERVICAL SPINE FINDINGS Alignment: No evidence of traumatic subluxation. Skull base and vertebrae: No fracture or destructive osseous lesion. Soft tissues and spinal canal: No prevertebral edema. Mild right-sided cervical atherosclerosis. Disc levels:  Mild cervical spondylosis, most notable at C6-7. Upper chest: Advanced centrilobular emphysema. Right greater than left apical lung scarring. Other: None. IMPRESSION: 1. No evidence of acute intracranial abnormality. 2. Mild right supraorbital soft  tissue swelling. 3. Moderately advanced chronic small vessel ischemic disease. 4. No acute abnormality identified in the cervical spine. Electronically Signed   By: Sebastian AcheAllen  Grady M.D.   On: 07/06/2016 13:51   Ct Cervical Spine Wo Contrast  Result Date: 07/25/2016 CLINICAL DATA:  Status post fall this morning with left-sided hip pain. Vomiting after fall. EXAM: CT HEAD WITHOUT CONTRAST CT CERVICAL SPINE WITHOUT CONTRAST TECHNIQUE: Multidetector CT imaging of the head and cervical spine was performed following the standard protocol without intravenous contrast. Multiplanar CT image reconstructions of the cervical spine were also generated. COMPARISON:  CT head and cervical spine dated 07/06/2016. FINDINGS: CT HEAD FINDINGS Brain: Again noted is generalized age related parenchymal atrophy with commensurate dilatation of the ventricles and sulci. Again noted are confluent areas of low density within the bilateral periventricular and subcortical white matter regions consistent with changes of chronic small vessel ischemia. Small old lacunar infarcts again noted within the left basal ganglia. There is no mass, hemorrhage, edema or other evidence of acute parenchymal abnormality. No extra-axial hemorrhage. Vascular: There are chronic calcified atherosclerotic changes of the large vessels at the skull base. No unexpected hyperdense vessel. Skull: Normal. Negative for fracture or focal lesion. Sinuses/Orbits: No acute finding. Other:  None CT CERVICAL SPINE FINDINGS Alignment: Dextroscoliosis of the lower cervical spine. Alignment otherwise normal. Skull base and vertebrae: No fracture line or displaced fracture fragment identified. Facet joints appear intact and normally aligned. Soft tissues and spinal canal: No prevertebral fluid or swelling. No visible canal hematoma. Disc levels: Mild degenerative change without significant central canal stenosis. Upper chest: Severe emphysematous changes within each lung apex. Other:  None IMPRESSION: 1. No acute intracranial abnormality. No intracranial mass, hemorrhage or edema. No skull fracture. Atrophy and chronic ischemic changes, as detailed above. 2. No fracture or acute subluxation within the cervical spine. Scoliosis and mild degenerative change. 3. Severe emphysematous changes within the lung apices. Electronically Signed   By: Bary Richard M.D.   On: 07/25/2016 16:16   Ct Cervical Spine Wo Contrast  Result Date: 07/06/2016 CLINICAL DATA:  Fall last night. Laceration above the right eye. Initial encounter. EXAM: CT HEAD WITHOUT CONTRAST CT CERVICAL SPINE WITHOUT CONTRAST TECHNIQUE: Multidetector CT imaging of the head and cervical spine was performed following the standard protocol without intravenous contrast. Multiplanar CT image reconstructions of the cervical spine were also generated. COMPARISON:  Brain MRI 12/03/2009. FINDINGS: CT HEAD FINDINGS Brain: There is moderate cerebral atrophy with greatest involvement of the parietal lobes. 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 There is no evidence of acute cortical infarct, intracranial hemorrhage, mass, midline shift, or extra-axial fluid collection. Patchy to confluent cerebral white matter hypodensities are nonspecific but compatible with moderate chronic small vessel ischemic disease, advanced for age. Vascular: Calcified atherosclerosis at the skullbase. Skull: No fracture identified. Sinuses/Orbits: Visualized paranasal sinuses and mastoid air cells are clear. Orbits are unremarkable. Other: Mild supraorbital soft tissue swelling. CT CERVICAL SPINE FINDINGS Alignment: No evidence of traumatic subluxation. Skull base and vertebrae: No fracture or destructive osseous lesion. Soft tissues and spinal canal: No prevertebral edema. Mild right-sided cervical atherosclerosis. Disc levels:  Mild cervical spondylosis, most notable at C6-7. Upper chest: Advanced centrilobular emphysema. Right greater  than left apical lung scarring. Other: None. IMPRESSION: 1. No evidence of acute intracranial abnormality. 2. Mild right supraorbital soft tissue swelling. 3. Moderately advanced chronic small vessel ischemic disease. 4. No acute abnormality identified in the cervical spine. Electronically Signed   By: Sebastian Ache M.D.   On: 07/06/2016 13:51   Dg Chest Port 1 View  Result Date: 07/07/2016 CLINICAL DATA:  Recent fall EXAM: PORTABLE CHEST 1 VIEW COMPARISON:  10/12/2015 FINDINGS: Cardiac shadow is within normal limits. The lungs are again hyperinflated. Some chronic scarring is noted in the bases bilaterally. No focal infiltrate or sizable effusion is seen. No acute bony abnormality is noted. IMPRESSION: No acute abnormality seen. Electronically Signed   By: Alcide Clever M.D.   On: 07/07/2016 08:52   Dg Knee Complete 4 Views Left  Result Date: 07/25/2016 CLINICAL DATA:  Acute onset of generalized left knee pain after fall. Initial encounter. EXAM: LEFT KNEE - COMPLETE 4+ VIEW COMPARISON:  None. FINDINGS: There is no evidence of fracture or dislocation. The joint spaces are preserved. No significant degenerative change is seen; the patellofemoral joint is grossly unremarkable in appearance. Trace knee joint fluid is within normal limits. A small enthesophyte is noted at the upper pole of the patella. Scattered vascular calcifications are seen. IMPRESSION: 1. No evidence of fracture or dislocation. 2. Scattered vascular calcifications seen. Electronically Signed   By: Roanna Raider M.D.   On: 07/25/2016 18:29   Dg Knee Complete 4 Views Right  Result Date: 07/06/2016 CLINICAL DATA:  Tripped over own feet and fell, weakness, anterior RIGHT laceration EXAM: RIGHT KNEE - COMPLETE 4+ VIEW COMPARISON:  Non FINDINGS: Osseous demineralization. Joint space narrowing. No acute fracture, dislocation, or bone destruction. No knee joint effusion. Scattered vascular calcifications. IMPRESSION: Osseous demineralization with  mild degenerative changes. No acute bony abnormalities identified. Electronically Signed   By: Ulyses Southward M.D.   On: 07/06/2016 13:21   Dg Hip Unilat W Or Wo Pelvis 2-3 Views Left  Result Date: 07/25/2016 CLINICAL DATA:  Fall this morning.  Left hip pain. EXAM: DG HIP (WITH OR WITHOUT PELVIS) 2-3V LEFT COMPARISON:  None. FINDINGS: Fracture of the proximal left femur with superior displacement of the distal component. This fracture is at the left femoral head and neck junction and compatible with a subcapital fracture. Prostate calcifications at the level of the pubic symphysis. Normal bowel gas pattern. Pubic rami are intact. Left femoral head is located. Right hip is grossly intact. IMPRESSION: Subcapital fracture in the left femur. Electronically Signed   By: Richarda Overlie M.D.   On: 07/25/2016 15:57     CBC  Recent Labs Lab 07/25/16 1612 07/26/16 0429  WBC 16.9* 15.7*  HGB 12.3* 11.5*  HCT 39.4 36.2*  PLT 202 164  MCV 94.9 94.0  MCH 29.6 29.9  MCHC 31.2 31.8  RDW 19.5* 19.3*  LYMPHSABS 1.5  --   MONOABS 0.6  --   EOSABS 0.0  --   BASOSABS 0.0  --     Chemistries   Recent Labs Lab 07/25/16 1612 07/26/16 0429  NA 136 139  K 4.3 5.5*  CL 109 109  CO2 20* 22  GLUCOSE 154* 118*  BUN 28* 24*  CREATININE 1.35* 1.26*  CALCIUM 8.9 8.8*  AST 26  --   ALT 19  --   ALKPHOS 88  --   BILITOT 0.6  --    ------------------------------------------------------------------------------------------------------------------ estimated creatinine clearance is 45.6 mL/min (by C-G formula based on SCr of 1.26 mg/dL (H)). ------------------------------------------------------------------------------------------------------------------ No results for input(s): HGBA1C in the last 72 hours. ------------------------------------------------------------------------------------------------------------------ No results for input(s): CHOL, HDL, LDLCALC, TRIG, CHOLHDL, LDLDIRECT in the last 72  hours. ------------------------------------------------------------------------------------------------------------------ No results for input(s): TSH, T4TOTAL, T3FREE, THYROIDAB in the last 72 hours.  Invalid input(s): FREET3 ------------------------------------------------------------------------------------------------------------------ No results for input(s): VITAMINB12, FOLATE, FERRITIN, TIBC, IRON, RETICCTPCT in the last 72 hours.  Coagulation profile No results for input(s): INR, PROTIME in the last 168 hours.  No results for input(s): DDIMER in the last 72 hours.  Cardiac Enzymes No results for input(s): CKMB, TROPONINI, MYOGLOBIN in the last 168 hours.  Invalid input(s): CK ------------------------------------------------------------------------------------------------------------------ Invalid input(s): POCBNP   CBG: No results for input(s): GLUCAP in the last 168 hours.     Studies: Dg Chest 1 View  Result Date: 07/25/2016 CLINICAL DATA:  Larey Seat this morning and injured left hip. EXAM: CHEST 1 VIEW COMPARISON:  07/07/2016 FINDINGS: The cardiac silhouette, mediastinal and hilar contours are within normal limits and stable. Moderate tortuosity of the thoracic aorta. Stable emphysematous changes and pulmonary scarring. No definite acute overlying pulmonary process. The bony thorax is intact. IMPRESSION: Emphysematous changes and pulmonary scarring but no acute overlying pulmonary findings. Electronically Signed   By: Rudie Meyer M.D.   On: 07/25/2016 15:56   Ct Head Wo Contrast  Result Date: 07/25/2016 CLINICAL DATA:  Status post fall this morning with left-sided hip pain. Vomiting after fall. EXAM: CT HEAD WITHOUT CONTRAST CT CERVICAL SPINE WITHOUT CONTRAST TECHNIQUE: Multidetector  CT imaging of the head and cervical spine was performed following the standard protocol without intravenous contrast. Multiplanar CT image reconstructions of the cervical spine were also  generated. COMPARISON:  CT head and cervical spine dated 07/06/2016. FINDINGS: CT HEAD FINDINGS Brain: Again noted is generalized age related parenchymal atrophy with commensurate dilatation of the ventricles and sulci. Again noted are confluent areas of low density within the bilateral periventricular and subcortical white matter regions consistent with changes of chronic small vessel ischemia. Small old lacunar infarcts again noted within the left basal ganglia. There is no mass, hemorrhage, edema or other evidence of acute parenchymal abnormality. No extra-axial hemorrhage. Vascular: There are chronic calcified atherosclerotic changes of the large vessels at the skull base. No unexpected hyperdense vessel. Skull: Normal. Negative for fracture or focal lesion. Sinuses/Orbits: No acute finding. Other: None CT CERVICAL SPINE FINDINGS Alignment: Dextroscoliosis of the lower cervical spine. Alignment otherwise normal. Skull base and vertebrae: No fracture line or displaced fracture fragment identified. Facet joints appear intact and normally aligned. Soft tissues and spinal canal: No prevertebral fluid or swelling. No visible canal hematoma. Disc levels: Mild degenerative change without significant central canal stenosis. Upper chest: Severe emphysematous changes within each lung apex. Other: None IMPRESSION: 1. No acute intracranial abnormality. No intracranial mass, hemorrhage or edema. No skull fracture. Atrophy and chronic ischemic changes, as detailed above. 2. No fracture or acute subluxation within the cervical spine. Scoliosis and mild degenerative change. 3. Severe emphysematous changes within the lung apices. Electronically Signed   By: Bary Richard M.D.   On: 07/25/2016 16:16   Ct Cervical Spine Wo Contrast  Result Date: 07/25/2016 CLINICAL DATA:  Status post fall this morning with left-sided hip pain. Vomiting after fall. EXAM: CT HEAD WITHOUT CONTRAST CT CERVICAL SPINE WITHOUT CONTRAST TECHNIQUE:  Multidetector CT imaging of the head and cervical spine was performed following the standard protocol without intravenous contrast. Multiplanar CT image reconstructions of the cervical spine were also generated. COMPARISON:  CT head and cervical spine dated 07/06/2016. FINDINGS: CT HEAD FINDINGS Brain: Again noted is generalized age related parenchymal atrophy with commensurate dilatation of the ventricles and sulci. Again noted are confluent areas of low density within the bilateral periventricular and subcortical white matter regions consistent with changes of chronic small vessel ischemia. Small old lacunar infarcts again noted within the left basal ganglia. There is no mass, hemorrhage, edema or other evidence of acute parenchymal abnormality. No extra-axial hemorrhage. Vascular: There are chronic calcified atherosclerotic changes of the large vessels at the skull base. No unexpected hyperdense vessel. Skull: Normal. Negative for fracture or focal lesion. Sinuses/Orbits: No acute finding. Other: None CT CERVICAL SPINE FINDINGS Alignment: Dextroscoliosis of the lower cervical spine. Alignment otherwise normal. Skull base and vertebrae: No fracture line or displaced fracture fragment identified. Facet joints appear intact and normally aligned. Soft tissues and spinal canal: No prevertebral fluid or swelling. No visible canal hematoma. Disc levels: Mild degenerative change without significant central canal stenosis. Upper chest: Severe emphysematous changes within each lung apex. Other: None IMPRESSION: 1. No acute intracranial abnormality. No intracranial mass, hemorrhage or edema. No skull fracture. Atrophy and chronic ischemic changes, as detailed above. 2. No fracture or acute subluxation within the cervical spine. Scoliosis and mild degenerative change. 3. Severe emphysematous changes within the lung apices. Electronically Signed   By: Bary Richard M.D.   On: 07/25/2016 16:16   Dg Knee Complete 4 Views  Left  Result Date: 07/25/2016 CLINICAL DATA:  Acute onset  of generalized left knee pain after fall. Initial encounter. EXAM: LEFT KNEE - COMPLETE 4+ VIEW COMPARISON:  None. FINDINGS: There is no evidence of fracture or dislocation. The joint spaces are preserved. No significant degenerative change is seen; the patellofemoral joint is grossly unremarkable in appearance. Trace knee joint fluid is within normal limits. A small enthesophyte is noted at the upper pole of the patella. Scattered vascular calcifications are seen. IMPRESSION: 1. No evidence of fracture or dislocation. 2. Scattered vascular calcifications seen. Electronically Signed   By: Roanna Raider M.D.   On: 07/25/2016 18:29   Dg Hip Unilat W Or Wo Pelvis 2-3 Views Left  Result Date: 07/25/2016 CLINICAL DATA:  Fall this morning.  Left hip pain. EXAM: DG HIP (WITH OR WITHOUT PELVIS) 2-3V LEFT COMPARISON:  None. FINDINGS: Fracture of the proximal left femur with superior displacement of the distal component. This fracture is at the left femoral head and neck junction and compatible with a subcapital fracture. Prostate calcifications at the level of the pubic symphysis. Normal bowel gas pattern. Pubic rami are intact. Left femoral head is located. Right hip is grossly intact. IMPRESSION: Subcapital fracture in the left femur. Electronically Signed   By: Richarda Overlie M.D.   On: 07/25/2016 15:57      No results found for: HGBA1C Lab Results  Component Value Date   CREATININE 1.26 (H) 07/26/2016       Scheduled Meds: . donepezil  10 mg Oral BH-q7a  . finasteride  5 mg Oral Daily  . fluticasone furoate-vilanterol  1 puff Inhalation Daily  . folic acid  1 mg Oral Daily  . metoprolol  25 mg Oral BID  . nicotine  14 mg Transdermal Daily  . predniSONE  5 mg Oral Daily  . QUEtiapine  50 mg Oral QHS  . saccharomyces boulardii  250 mg Oral BID  . senna-docusate  1 tablet Oral QHS   Continuous Infusions: . sodium chloride 75 mL/hr at  07/26/16 0500     LOS: 1 day    Time spent: >30 MINS    [facility]  [facility] Pager [phone]. If 7PM-7AM, please contact night-coverage at [email or web address], password [facility] 07/26/2016, 8:02 AM  LOS: 1 day

## 2016-07-26 NOTE — H&P (View-Only) (Signed)
Paul Hansen is an 70 y.o. male.    Chief Complaint: left hip pain  HPI: 70 y/o male resident at nursing facility sustained a ground level fall earlier this morning while walking. C/o immediate left hip pain and inability to bear weight. Denies any other injuries s/p this fall. Denies any LOC, dizziness but report of possible seizure like activity with vomiting after the fall. Staff who report the incident has left for the day and no further information able to be obtained. Currently awake, appropriate and answers questions easily.  PCP:  Myrtis Hopping, MD  PMH: Past Medical History:  Diagnosis Date  . Bipolar disorder ([place])   . Hypertension   . Rheumatoid arthritis ([place])   . Stroke ([place])   . Vascular dementia     PSH: Past Surgical History:  Procedure Laterality Date  . NO PAST SURGERIES      Social History:  reports that he has been smoking.  He has never used smokeless tobacco. He reports that he does not drink alcohol or use drugs.  Allergies:  No Known Allergies  Medications: Current Facility-Administered Medications  Medication Dose Route Frequency Provider Last Rate Last Dose  . [START ON 07/26/2016] donepezil (ARICEPT) tablet 10 mg  10 mg Oral BH-q7a Norval Morton, MD      . Derrill Memo ON 07/26/2016] enoxaparin (LOVENOX) injection 40 mg  40 mg Subcutaneous Q24H Rondell A Tamala Julian, MD      . finasteride (PROSCAR) tablet 5 mg  5 mg Oral Daily Rondell A Tamala Julian, MD      . fluticasone furoate-vilanterol (BREO ELLIPTA) 100-25 MCG/INH 1 puff  1 puff Inhalation Daily Rondell Charmayne Sheer, MD      . folic acid (FOLVITE) tablet 1 mg  1 mg Oral Daily Rondell A Tamala Julian, MD      . HYDROcodone-acetaminophen (NORCO/VICODIN) 5-325 MG per tablet 1-2 tablet  1-2 tablet Oral Q6H PRN Norval Morton, MD      . metoprolol tartrate (LOPRESSOR) tablet 25 mg  25 mg Oral BID Rondell A Tamala Julian, MD      . morphine 2 MG/ML injection 0.5 mg  0.5 mg Intravenous Q2H PRN Rondell A Tamala Julian, MD      . nicotine  (NICODERM CQ - dosed in mg/24 hours) patch 14 mg  14 mg Transdermal Daily Rondell A Tamala Julian, MD      . predniSONE (DELTASONE) tablet 5 mg  5 mg Oral Daily Rondell A Tamala Julian, MD      . QUEtiapine (SEROQUEL) tablet 50 mg  50 mg Oral QHS Rondell A Smith, MD      . saccharomyces boulardii (FLORASTOR) capsule 250 mg  250 mg Oral BID Rondell A Tamala Julian, MD      . senna-docusate (Senokot-S) tablet 1 tablet  1 tablet Oral QHS Norval Morton, MD       Current Outpatient Prescriptions  Medication Sig Dispense Refill  . donepezil (ARICEPT) 10 MG tablet Take 10 mg by mouth every morning.     . finasteride (PROSCAR) 5 MG tablet Take 5 mg by mouth daily.    . fluticasone furoate-vilanterol (BREO ELLIPTA) 100-25 MCG/INH AEPB Inhale 1 puff into the lungs daily.    . folic acid (FOLVITE) 1 MG tablet Take 1 mg by mouth daily.    . metoprolol (LOPRESSOR) 50 MG tablet Take 25 mg by mouth 2 (two) times daily.     . nicotine (NICODERM CQ - DOSED IN MG/24 HOURS) 14 mg/24hr patch Place 1 patch (14 mg  total) onto the skin daily.    . predniSONE (DELTASONE) 5 MG tablet Take 5 mg by mouth daily.    . QUEtiapine (SEROQUEL) 50 MG tablet Take 1 tablet (50 mg total) by mouth at bedtime.    . saccharomyces boulardii (FLORASTOR) 250 MG capsule Take 1 capsule (250 mg total) by mouth 2 (two) times daily.    . collagenase (SANTYL) ointment Apply topically daily. Apply Santyl to right outer leg wound Q day, then cover with moist fluffed gauze and foam dressing.  (Change foam dressing Q 5 days or PRN soiling.) (Patient not taking: Reported on 07/25/2016)    . feeding supplement (BOOST / RESOURCE BREEZE) LIQD Take 1 Container by mouth 3 (three) times daily between meals. (Patient not taking: Reported on 07/25/2016)    . nystatin (MYCOSTATIN/NYSTOP) powder Apply topically 2 (two) times daily. Apply to inguinal folds (Patient not taking: Reported on 07/25/2016)    . vancomycin (VANCOCIN) 50 mg/mL oral solution Take 2.5 mLs (125 mg total) by  mouth 4 (four) times daily. Discontinue after 07/20/16 doses. (Patient not taking: Reported on 07/25/2016)      Results for orders placed or performed during the hospital encounter of 07/25/16 (from the past 48 hour(s))  CBC with Differential     Status: Abnormal   Collection Time: 07/25/16  4:12 PM  Result Value Ref Range   WBC 16.9 (H) 4.0 - 10.5 K/uL   RBC 4.15 (L) 4.22 - 5.81 MIL/uL   Hemoglobin 12.3 (L) 13.0 - 17.0 g/dL   HCT 39.4 39.0 - 52.0 %   MCV 94.9 78.0 - 100.0 fL   MCH 29.6 26.0 - 34.0 pg   MCHC 31.2 30.0 - 36.0 g/dL   RDW 19.5 (H) 11.5 - 15.5 %   Platelets 202 150 - 400 K/uL   Neutrophils Relative % 88 %   Neutro Abs 14.8 (H) 1.7 - 7.7 K/uL   Lymphocytes Relative 9 %   Lymphs Abs 1.5 0.7 - 4.0 K/uL   Monocytes Relative 3 %   Monocytes Absolute 0.6 0.1 - 1.0 K/uL   Eosinophils Relative 0 %   Eosinophils Absolute 0.0 0.0 - 0.7 K/uL   Basophils Relative 0 %   Basophils Absolute 0.0 0.0 - 0.1 K/uL  Comprehensive metabolic panel     Status: Abnormal   Collection Time: 07/25/16  4:12 PM  Result Value Ref Range   Sodium 136 135 - 145 mmol/L   Potassium 4.3 3.5 - 5.1 mmol/L   Chloride 109 101 - 111 mmol/L   CO2 20 (L) 22 - 32 mmol/L   Glucose, Bld 154 (H) 65 - 99 mg/dL   BUN 28 (H) 6 - 20 mg/dL   Creatinine, Ser 1.35 (H) 0.61 - 1.24 mg/dL   Calcium 8.9 8.9 - 10.3 mg/dL   Total Protein 6.8 6.5 - 8.1 g/dL   Albumin 2.9 (L) 3.5 - 5.0 g/dL   AST 26 15 - 41 U/L   ALT 19 17 - 63 U/L   Alkaline Phosphatase 88 38 - 126 U/L   Total Bilirubin 0.6 0.3 - 1.2 mg/dL   GFR calc non Af Amer 51 (L) >60 mL/min   GFR calc Af Amer 59 (L) >60 mL/min    Comment: (NOTE) The eGFR has been calculated using the CKD EPI equation. This calculation has not been validated in all clinical situations. eGFR's persistently <60 mL/min signify possible Chronic Kidney Disease.    Anion gap 7 5 - 15  Lipase, blood  Status: Abnormal   Collection Time: 07/25/16  4:12 PM  Result Value Ref Range     Lipase 52 (H) 11 - 51 U/L   Dg Chest 1 View  Result Date: 07/25/2016 CLINICAL DATA:  [place] this morning and injured left hip. EXAM: CHEST 1 VIEW COMPARISON:  07/07/2016 FINDINGS: The cardiac silhouette, mediastinal and hilar contours are within normal limits and stable. Moderate tortuosity of the thoracic aorta. Stable emphysematous changes and pulmonary scarring. No definite acute overlying pulmonary process. The bony thorax is intact. IMPRESSION: Emphysematous changes and pulmonary scarring but no acute overlying pulmonary findings. Electronically Signed   By: Marijo Sanes M.D.   On: 07/25/2016 15:56   Ct Head Wo Contrast  Result Date: 07/25/2016 CLINICAL DATA:  Status post fall this morning with left-sided hip pain. Vomiting after fall. EXAM: CT HEAD WITHOUT CONTRAST CT CERVICAL SPINE WITHOUT CONTRAST TECHNIQUE: Multidetector CT imaging of the head and cervical spine was performed following the standard protocol without intravenous contrast. Multiplanar CT image reconstructions of the cervical spine were also generated. COMPARISON:  CT head and cervical spine dated 07/06/2016. FINDINGS: CT HEAD FINDINGS Brain: Again noted is generalized age related parenchymal atrophy with commensurate dilatation of the ventricles and sulci. Again noted are confluent areas of low density within the bilateral periventricular and subcortical white matter regions consistent with changes of chronic small vessel ischemia. Small old lacunar infarcts again noted within the left basal ganglia. There is no mass, hemorrhage, edema or other evidence of acute parenchymal abnormality. No extra-axial hemorrhage. Vascular: There are chronic calcified atherosclerotic changes of the large vessels at the skull base. No unexpected hyperdense vessel. Skull: Normal. Negative for fracture or focal lesion. Sinuses/Orbits: No acute finding. Other: None CT CERVICAL SPINE FINDINGS Alignment: Dextroscoliosis of the lower cervical spine.  Alignment otherwise normal. Skull base and vertebrae: No fracture line or displaced fracture fragment identified. Facet joints appear intact and normally aligned. Soft tissues and spinal canal: No prevertebral fluid or swelling. No visible canal hematoma. Disc levels: Mild degenerative change without significant central canal stenosis. Upper chest: Severe emphysematous changes within each lung apex. Other: None IMPRESSION: 1. No acute intracranial abnormality. No intracranial mass, hemorrhage or edema. No skull fracture. Atrophy and chronic ischemic changes, as detailed above. 2. No fracture or acute subluxation within the cervical spine. Scoliosis and mild degenerative change. 3. Severe emphysematous changes within the lung apices. Electronically Signed   By: Franki Cabot M.D.   On: 07/25/2016 16:16   Ct Cervical Spine Wo Contrast  Result Date: 07/25/2016 CLINICAL DATA:  Status post fall this morning with left-sided hip pain. Vomiting after fall. EXAM: CT HEAD WITHOUT CONTRAST CT CERVICAL SPINE WITHOUT CONTRAST TECHNIQUE: Multidetector CT imaging of the head and cervical spine was performed following the standard protocol without intravenous contrast. Multiplanar CT image reconstructions of the cervical spine were also generated. COMPARISON:  CT head and cervical spine dated 07/06/2016. FINDINGS: CT HEAD FINDINGS Brain: Again noted is generalized age related parenchymal atrophy with commensurate dilatation of the ventricles and sulci. Again noted are confluent areas of low density within the bilateral periventricular and subcortical white matter regions consistent with changes of chronic small vessel ischemia. Small old lacunar infarcts again noted within the left basal ganglia. There is no mass, hemorrhage, edema or other evidence of acute parenchymal abnormality. No extra-axial hemorrhage. Vascular: There are chronic calcified atherosclerotic changes of the large vessels at the skull base. No unexpected  hyperdense vessel. Skull: Normal. Negative for fracture or focal  lesion. Sinuses/Orbits: No acute finding. Other: None CT CERVICAL SPINE FINDINGS Alignment: Dextroscoliosis of the lower cervical spine. Alignment otherwise normal. Skull base and vertebrae: No fracture line or displaced fracture fragment identified. Facet joints appear intact and normally aligned. Soft tissues and spinal canal: No prevertebral fluid or swelling. No visible canal hematoma. Disc levels: Mild degenerative change without significant central canal stenosis. Upper chest: Severe emphysematous changes within each lung apex. Other: None IMPRESSION: 1. No acute intracranial abnormality. No intracranial mass, hemorrhage or edema. No skull fracture. Atrophy and chronic ischemic changes, as detailed above. 2. No fracture or acute subluxation within the cervical spine. Scoliosis and mild degenerative change. 3. Severe emphysematous changes within the lung apices. Electronically Signed   By: Franki Cabot M.D.   On: 07/25/2016 16:16   Dg Knee Complete 4 Views Left  Result Date: 07/25/2016 CLINICAL DATA:  Acute onset of generalized left knee pain after fall. Initial encounter. EXAM: LEFT KNEE - COMPLETE 4+ VIEW COMPARISON:  None. FINDINGS: There is no evidence of fracture or dislocation. The joint spaces are preserved. No significant degenerative change is seen; the patellofemoral joint is grossly unremarkable in appearance. Trace knee joint fluid is within normal limits. A small enthesophyte is noted at the upper pole of the patella. Scattered vascular calcifications are seen. IMPRESSION: 1. No evidence of fracture or dislocation. 2. Scattered vascular calcifications seen. Electronically Signed   By: Garald Balding M.D.   On: 07/25/2016 18:29   Dg Hip Unilat W Or Wo Pelvis 2-3 Views Left  Result Date: 07/25/2016 CLINICAL DATA:  Fall this morning.  Left hip pain. EXAM: DG HIP (WITH OR WITHOUT PELVIS) 2-3V LEFT COMPARISON:  None. FINDINGS:  Fracture of the proximal left femur with superior displacement of the distal component. This fracture is at the left femoral head and neck junction and compatible with a subcapital fracture. Prostate calcifications at the level of the pubic symphysis. Normal bowel gas pattern. Pubic rami are intact. Left femoral head is located. Right hip is grossly intact. IMPRESSION: Subcapital fracture in the left femur. Electronically Signed   By: Markus Daft M.D.   On: 07/25/2016 15:57    ROS: ROS  Currently lives in nursing facility Unable to ambulate currently  Physical Exam: Awake and appropriate 70 y/o male answering questions appropriately Cervical spine with full rom, no tenderness Bilateral upper extremities with full rom, no tenderness or deformity Left lower extremity with shortening and mild internal rotation nv intact distally Right lower extremity, small abrasion to anterior knee otherwise negative No signs of open injury or other deformity  Physical Exam   Assessment/Plan Assessment: left femur fracture  Plan: Pt to be admitted by hospital staff and plan for surgery for the left hip once cleared medically, possibly as early as tomorrow NPO after midnight Pain management as needed Strict bedrest currently       ADDENDUM: patient seen and examined. Proceed with surgery tonight if medically cleared

## 2016-07-26 NOTE — Progress Notes (Addendum)
Trying to get a surgical consent from pt's legal guardian, Jewel [phone], no answer, left a message. 1430 Left messages to pt's legal guardian [phone] and [phone]. No call back yet. 1450 Legal guardian Mertie Moores [phone], called back and given a telephone consent for surgery. Informed consent for surgery complete. 1620 Report given to [facility] in preop. Pt to preop via bed.

## 2016-07-26 NOTE — Anesthesia Postprocedure Evaluation (Signed)
Anesthesia Post Note  Patient: Paul Hansen  Procedure(s) Performed: Procedure(s) (LRB): ANTERIOR APPROACH HEMI HIP ARTHROPLASTY (Left)  Patient location during evaluation: PACU Anesthesia Type: General Level of consciousness: awake, awake and alert and oriented Pain management: pain level controlled Vital Signs Assessment: post-procedure vital signs reviewed and stable Respiratory status: spontaneous breathing, nonlabored ventilation and respiratory function stable Cardiovascular status: blood pressure returned to baseline Anesthetic complications: no    Last Vitals:  Vitals:   07/26/16 2244 07/26/16 2253  BP: 110/71 114/60  Pulse: (!) 57 (!) 57  Resp: 20 18  Temp:      Last Pain:  Vitals:   07/26/16 1723  TempSrc:   PainSc: 10-Worst pain ever    LLE Motor Response: No movement to painful stimulus (07/26/16 2253) LLE Sensation: Decreased (07/26/16 2253) RLE Motor Response: No movement due to regional block (07/26/16 2253) RLE Sensation: Decreased (07/26/16 2253)      Michai Dieppa COKER

## 2016-07-26 NOTE — Transfer of Care (Signed)
Immediate Anesthesia Transfer of Care Note  Patient: Paul Hansen  Procedure(s) Performed: Procedure(s): ANTERIOR APPROACH HEMI HIP ARTHROPLASTY (Left)  Patient Location: PACU  Anesthesia Type:General and Spinal  Level of Consciousness: awake  Airway & Oxygen Therapy: Patient Spontanous Breathing and Patient connected to nasal cannula oxygen  Post-op Assessment: Report given to RN and Post -op Vital signs reviewed and stable  Post vital signs: Reviewed and stable  Last Vitals:  Vitals:   07/26/16 0542 07/26/16 1026  BP: 107/63 112/65  Pulse: (!) 59 (!) 55  Resp: 18   Temp: 36.6 C     Last Pain:  Vitals:   07/26/16 1723  TempSrc:   PainSc: 10-Worst pain ever      Patients Stated Pain Goal: 0 (07/26/16 1723)  Complications: No apparent anesthesia complications

## 2016-07-26 NOTE — Clinical Social Work Note (Signed)
CSW confirmed with [facility] that Legal Guardian is [place], [phone] or [phone].   Roddie Mc MSW, [place], [place], [phone]

## 2016-07-26 NOTE — Anesthesia Procedure Notes (Signed)
Procedure Name: MAC Date/Time: 07/26/2016 7:45 PM Performed by: Nicholos Johns Pre-anesthesia Checklist: Patient identified, Emergency Drugs available, Suction available, Patient being monitored and Timeout performed Patient Re-evaluated:Patient Re-evaluated prior to inductionOxygen Delivery Method: Simple face mask Preoxygenation: Pre-oxygenation with 100% oxygen Placement Confirmation: positive ETCO2 and breath sounds checked- equal and bilateral

## 2016-07-26 NOTE — Discharge Instructions (Signed)
°Dr. Tymier Lindholm °Joint Replacement Specialist °[facility] °[address] °[place], [place] [address] °[phone] ° ° °TOTAL HIP REPLACEMENT POSTOPERATIVE DIRECTIONS ° ° ° °Hip Rehabilitation, Guidelines Following Surgery  ° °WEIGHT BEARING °Weight bearing as tolerated with assist device (walker, cane, etc) as directed, use it as long as suggested by your surgeon or therapist, typically at least 4-6 weeks. ° °The results of a hip operation are greatly improved after range of motion and muscle strengthening exercises. Follow all safety measures which are given to protect your hip. If any of these exercises cause increased pain or swelling in your joint, decrease the amount until you are comfortable again. Then slowly increase the exercises. Call your caregiver if you have problems or questions.  ° °HOME CARE INSTRUCTIONS  °Most of the following instructions are designed to prevent the dislocation of your new hip.  °Remove items at home which could result in a fall. This includes throw rugs or furniture in walking pathways.  °Continue medications as instructed at time of discharge. °· You may have some home medications which will be placed on hold until you complete the course of blood thinner medication. °· You may start showering once you are discharged home. Do not remove your dressing. °Do not put on socks or shoes without following the instructions of your caregivers.   °Sit on chairs with arms. Use the chair arms to help push yourself up when arising.  °Arrange for the use of a toilet seat elevator so you are not sitting low.  °· Walk with walker as instructed.  °You may resume a sexual relationship in one month or when given the OK by your caregiver.  °Use walker as long as suggested by your caregivers.  °You may put full weight on your legs and walk as much as is comfortable. °Avoid periods of inactivity such as sitting longer than an hour when not asleep. This helps prevent  blood clots.  °You may return to work once you are cleared by your surgeon.  °Do not drive a car for 6 weeks or until released by your surgeon.  °Do not drive while taking narcotics.  °Wear elastic stockings for two weeks following surgery during the day but you may remove then at night.  °Make sure you keep all of your appointments after your operation with all of your doctors and caregivers. You should call the office at the above phone number and make an appointment for approximately two weeks after the date of your surgery. °Please pick up a stool softener and laxative for home use as long as you are requiring pain medications. °· ICE to the affected hip every three hours for 30 minutes at a time and then as needed for pain and swelling. Continue to use ice on the hip for pain and swelling from surgery. You may notice swelling that will progress down to the foot and ankle.  This is normal after surgery.  Elevate the leg when you are not up walking on it.   °It is important for you to complete the blood thinner medication as prescribed by your doctor. °· Continue to use the breathing machine which will help keep your temperature down.  It is common for your temperature to cycle up and down following surgery, especially at night when you are not up moving around and exerting yourself.  The breathing machine keeps your lungs expanded and your temperature down. ° °RANGE OF MOTION AND STRENGTHENING EXERCISES  °These exercises are   designed to help you keep full movement of your hip joint. Follow your caregiver's or physical therapist's instructions. Perform all exercises about fifteen times, three times per day or as directed. Exercise both hips, even if you have had only one joint replacement. These exercises can be done on a training (exercise) mat, on the floor, on a table or on a bed. Use whatever works the best and is most comfortable for you. Use music or television while you are exercising so that the exercises  are a pleasant break in your day. This will make your life better with the exercises acting as a break in routine you can look forward to.  °Lying on your back, slowly slide your foot toward your buttocks, raising your knee up off the floor. Then slowly slide your foot back down until your leg is straight again.  °Lying on your back spread your legs as far apart as you can without causing discomfort.  °Lying on your side, raise your upper leg and foot straight up from the floor as far as is comfortable. Slowly lower the leg and repeat.  °Lying on your back, tighten up the muscle in the front of your thigh (quadriceps muscles). You can do this by keeping your leg straight and trying to raise your heel off the floor. This helps strengthen the largest muscle supporting your knee.  °Lying on your back, tighten up the muscles of your buttocks both with the legs straight and with the knee bent at a comfortable angle while keeping your heel on the floor.  ° °SKILLED REHAB INSTRUCTIONS: °If the patient is transferred to a skilled rehab facility following release from the hospital, a list of the current medications will be sent to the facility for the patient to continue.  When discharged from the skilled rehab facility, please have the facility set up the patient's Home Health Physical Therapy prior to being released. Also, the skilled facility will be responsible for providing the patient with their medications at time of release from the facility to include their pain medication and their blood thinner medication. If the patient is still at the rehab facility at time of the two week follow up appointment, the skilled rehab facility will also need to assist the patient in arranging follow up appointment in our office and any transportation needs. ° °MAKE SURE YOU:  °Understand these instructions.  °Will watch your condition.  °Will get help right away if you are not doing well or get worse. ° °Pick up stool softner and  laxative for home use following surgery while on pain medications. °Do not remove your dressing. °The dressing is waterproof--it is OK to take showers. °Continue to use ice for pain and swelling after surgery. °Do not use any lotions or creams on the incision until instructed by your surgeon. °Total Hip Protocol. ° ° °

## 2016-07-26 NOTE — Anesthesia Procedure Notes (Signed)
Spinal  Patient location during procedure: OR Start time: 07/26/2016 7:45 PM End time: 07/26/2016 7:50 PM Staffing Performed: anesthesiologist  Preanesthetic Checklist Completed: patient identified, site marked, surgical consent, pre-op evaluation, timeout performed, IV checked, risks and benefits discussed and monitors and equipment checked Spinal Block Patient position: left lateral decubitus Prep: ChloraPrep Patient monitoring: heart rate, cardiac monitor, continuous pulse ox and blood pressure Approach: midline Location: L3-4 Injection technique: single-shot Needle Needle type: Tuohy  Needle gauge: 22 G Needle length: 9 cm Needle insertion depth: 5 cm Assessment Sensory level: T8 Additional Notes 12 mg 1% tetracaine injected easily

## 2016-07-26 NOTE — Anesthesia Procedure Notes (Signed)
Procedure Name: Intubation Date/Time: 07/26/2016 8:40 PM Performed by: Byrd Rushlow S Pre-anesthesia Checklist: Patient identified, Emergency Drugs available, Suction available, Patient being monitored and Timeout performed Patient Re-evaluated:Patient Re-evaluated prior to inductionOxygen Delivery Method: Circle system utilized Preoxygenation: Pre-oxygenation with 100% oxygen Intubation Type: IV induction and Rapid sequence Ventilation: Mask ventilation without difficulty Laryngoscope Size: Mac and 4 Grade View: Grade I Tube size: 8.0 mm Number of attempts: 1 Airway Equipment and Method: Stylet Placement Confirmation: ETT inserted through vocal cords under direct vision,  positive ETCO2 and breath sounds checked- equal and bilateral Secured at: 21 cm Tube secured with: Tape Dental Injury: Teeth and Oropharynx as per pre-operative assessment

## 2016-07-26 NOTE — Op Note (Signed)
OPERATIVE REPORT  SURGEON: Samson Frederic, MD   ASSISTANT: April Green, RNFA  PREOPERATIVE DIAGNOSIS: Displaced Left femoral neck fracture.   POSTOPERATIVE DIAGNOSIS: Displaced Left femoral neck fracture.   PROCEDURE: Left hip hemiarthroplasty, anterior approach.   IMPLANTS: DePuy Tri Lock stem, size 9, std offset, with a 0 mm spacer and a 51 mm monopolar head ball.  ANESTHESIA:  General and Spinal  ANTIBIOTICS: 2g ancef.  ESTIMATED BLOOD LOSS: 250 mL.  DRAINS: None.  COMPLICATIONS: None   CONDITION: PACU - hemodynamically stable.   BRIEF CLINICAL NOTE: Paul Hansen is a 70 y.o. male with a displaced Left femoral neck fracture. The patient was admitted to the hospitalist service and underwent perioperative risk stratification and medical optimization. The risks, benefits, and alternatives to hemiarthroplasty were explained, and the patient elected to proceed.  PROCEDURE IN DETAIL: The patient was taken to the operating room and spinal anesthesia was induced on the hospital bed. The patient was then positioned on the Hana table. All bony prominences were well padded. The hip was prepped and draped in the normal sterile surgical fashion. A time-out was called verifying side and site of surgery. Antibiotics were given within 60 minutes of beginning the procedure.  The direct anterior approach to the hip was performed through the Hueter interval. The spinal failed to provide adequate anesthesia, and therefore general anesthesia was obtained. Lateral femoral circumflex vessels were treated with the Auqumantys. The anterior capsule was exposed and an inverted T capsulotomy was made. Fracture hematoma was encountered and evacuated. The patient was found to have a comminuted Left subcapital femoral neck fracture. I freshened the femoral neck cut with a saw. I removed the femoral neck fragment. A corkscrew was placed into the head and the head was removed. This was passed to  the back table and was measured.  Acetabular exposure was achieved. I examined the articular cartilage which was intact. The labrum was intact. A 51 mm trial head was placed and found to have excellent fit.  I then gained femoral exposure taking care to protect the abductors and greater trochanter. This was performed using standard external rotation, extension, and adduction. The capsule was peeled off the inner aspect of the greater trochanter, taking care to preserve the short external rotators. A cookie cutter was used to enter the femoral canal, and then the femoral canal finder was used to confirm location. I then sequentially broached up to a size 9. Calcar planer was used on the femoral neck remnant. I paced a std neck and a 36+ 0 head ball.The hip was reduced. Leg lengths were checked fluoroscopically. The hip was dislocated and trial components were removed. I placed the real stem followed by the real spacer and head ball. A single reduction maneuver was performed and the hip was reduced. Fluoroscopy was used to confirm component position and leg lengths. At 90 degrees of external rotation and extension, the hip was stable to an anterior directed force.  The wound was copiously irrigated with normal saline solution. Marcaine solution was injected into the periarticular soft tissue. The wound was closed in layers using #1 Vicryl and V-Loc for the fascia, 2-0 Vicryl for the subcutaneous fat, 2-0 Monocryl for the deep dermal layer, 3-0 running Monocryl subcuticular stitch and glue for the skin. Once the glue was fully dried, an Aquacell Ag dressing was applied. The patient was then awakened from anesthesia and transported to the recovery room in stable condition. Sponge, needle, and instrument counts were correct at the  end of the case x2. The patient tolerated the procedure well and there were no known complications.

## 2016-07-26 NOTE — Anesthesia Preprocedure Evaluation (Signed)
Anesthesia Evaluation  Patient identified by MRN, date of birth, ID band Patient confused    Reviewed: Allergy & Precautions, NPO status , Patient's Chart, lab work & pertinent test results, Unable to perform ROS - Chart review only  Airway Mallampati: II  TM Distance: >3 FB     Dental  (+) Edentulous Upper, Edentulous Lower   Pulmonary Current Smoker,     + decreased breath sounds      Cardiovascular hypertension,  Rhythm:Regular     Neuro/Psych    GI/Hepatic   Endo/Other    Renal/GU      Musculoskeletal   Abdominal   Peds  Hematology   Anesthesia Other Findings   Reproductive/Obstetrics                             Anesthesia Physical Anesthesia Plan  ASA: III  Anesthesia Plan: Spinal and MAC   Post-op Pain Management:    Induction: Intravenous  Airway Management Planned: Simple Face Mask and Natural Airway  Additional Equipment:   Intra-op Plan:   Post-operative Plan:   Informed Consent: I have reviewed the patients History and Physical, chart, labs and discussed the procedure including the risks, benefits and alternatives for the proposed anesthesia with the patient or authorized representative who has indicated his/her understanding and acceptance.     Plan Discussed with: CRNA and Anesthesiologist  Anesthesia Plan Comments: (70 year old male with vascular dementia and L. Subcapital femur fracture. Patient stated he does not want to go to sleep and would prefer spinal. Will plan on SAB if patient is able to cooperate, otherwise will convert to LA.  Kipp Brood)        Anesthesia Quick Evaluation

## 2016-07-26 NOTE — Interval H&P Note (Signed)
History and Physical Interval Note:  07/26/2016 7:17 PM  Dellia Beckwith  has presented today for surgery, with the diagnosis of left hip fracture  The various methods of treatment have been discussed with the patient and family. After consideration of risks, benefits and other options for treatment, the patient has consented to  Procedure(s): ANTERIOR APPROACH HEMI HIP ARTHROPLASTY (Left) as a surgical intervention .  The patient's history has been reviewed, patient examined, no change in status, stable for surgery.  I have reviewed the patient's chart and labs.  Questions were answered to the patient's satisfaction.    The risks, benefits, and alternatives were discussed with the patient / POA. There are risks associated with the surgery including, but not limited to, problems with anesthesia (death), infection, instability (giving out of the joint), dislocation, differences in leg length/angulation/rotation, fracture of bones, loosening or failure of implants, hematoma (blood accumulation) which may require surgical drainage, blood clots, pulmonary embolism, nerve injury (foot drop and lateral thigh numbness), and blood vessel injury. They understand these risks and elect to proceed.    Florentine Diekman, Cloyde Reams

## 2016-07-27 DIAGNOSIS — T148 Other injury of unspecified body region: Secondary | ICD-10-CM

## 2016-07-27 DIAGNOSIS — T148XXA Other injury of unspecified body region, initial encounter: Secondary | ICD-10-CM

## 2016-07-27 DIAGNOSIS — L89151 Pressure ulcer of sacral region, stage 1: Secondary | ICD-10-CM

## 2016-07-27 DIAGNOSIS — S72011A Unspecified intracapsular fracture of right femur, initial encounter for closed fracture: Secondary | ICD-10-CM

## 2016-07-27 LAB — COMPREHENSIVE METABOLIC PANEL
ALBUMIN: 2 g/dL — AB (ref 3.5–5.0)
ALK PHOS: 65 U/L (ref 38–126)
ALT: 13 U/L — ABNORMAL LOW (ref 17–63)
ANION GAP: 7 (ref 5–15)
AST: 19 U/L (ref 15–41)
BUN: 19 mg/dL (ref 6–20)
CO2: 22 mmol/L (ref 22–32)
Calcium: 7.8 mg/dL — ABNORMAL LOW (ref 8.9–10.3)
Chloride: 108 mmol/L (ref 101–111)
Creatinine, Ser: 1.36 mg/dL — ABNORMAL HIGH (ref 0.61–1.24)
GFR calc Af Amer: 59 mL/min — ABNORMAL LOW (ref >60)
GFR calc non Af Amer: 51 mL/min — ABNORMAL LOW (ref >60)
GLUCOSE: 86 mg/dL (ref 65–99)
POTASSIUM: 4.4 mmol/L (ref 3.5–5.1)
SODIUM: 137 mmol/L (ref 135–145)
Total Bilirubin: 1.3 mg/dL — ABNORMAL HIGH (ref 0.3–1.2)
Total Protein: 5.1 g/dL — ABNORMAL LOW (ref 6.5–8.1)

## 2016-07-27 LAB — CBC
HCT: 28.7 % — ABNORMAL LOW (ref 39.0–52.0)
HEMOGLOBIN: 9.3 g/dL — AB (ref 13.0–17.0)
MCH: 30.4 pg (ref 26.0–34.0)
MCHC: 32.4 g/dL (ref 30.0–36.0)
MCV: 93.8 fL (ref 78.0–100.0)
PLATELETS: 123 10*3/uL — AB (ref 150–400)
RBC: 3.06 MIL/uL — AB (ref 4.22–5.81)
RDW: 19.3 % — ABNORMAL HIGH (ref 11.5–15.5)
WBC: 9.9 10*3/uL (ref 4.0–10.5)

## 2016-07-27 MED ORDER — HYDROCORTISONE NA SUCCINATE PF 100 MG IJ SOLR
50.0000 mg | Freq: Three times a day (TID) | INTRAMUSCULAR | Status: DC
  Administered 2016-07-27: 50 mg via INTRAVENOUS
  Filled 2016-07-27: qty 2

## 2016-07-27 MED ORDER — ENOXAPARIN SODIUM 40 MG/0.4ML ~~LOC~~ SOLN: 40.0000 mg | SUBCUTANEOUS | Status: DC

## 2016-07-27 MED ORDER — PREDNISONE 5 MG PO TABS
5.0000 mg | ORAL_TABLET | Freq: Every day | ORAL | Status: DC
  Administered 2016-07-28: 5 mg via ORAL
  Filled 2016-07-27: qty 1

## 2016-07-27 NOTE — Progress Notes (Signed)
   Subjective: Patient reports pain as mild to moderate.  No c/o.  Objective:   VITALS:   Vitals:   07/26/16 2244 07/26/16 2253 07/26/16 2309 07/27/16 0609  BP: 110/71 114/60 107/89 (!) 90/40  Pulse: (!) 57 (!) 57 (!) 56 69  Resp: 20 18 17 18   Temp:   98 F (36.7 C) 98.2 F (36.8 C)  TempSrc:   Oral Oral  SpO2: 93% 99% 99% 99%  Weight:      Height:        NAD ABD soft Sensation intact distally Intact pulses distally Dorsiflexion/Plantar flexion intact Incision: dressing C/D/I Compartment soft   Lab Results  Component Value Date   WBC 9.9 07/27/2016   HGB 9.3 (L) 07/27/2016   HCT 28.7 (L) 07/27/2016   MCV 93.8 07/27/2016   PLT 123 (L) 07/27/2016   BMET    Component Value Date/Time   NA 137 07/27/2016 0548   K 4.4 07/27/2016 0548   CL 108 07/27/2016 0548   CO2 22 07/27/2016 0548   GLUCOSE 86 07/27/2016 0548   BUN 19 07/27/2016 0548   CREATININE 1.36 (H) 07/27/2016 0548   CALCIUM 7.8 (L) 07/27/2016 0548   GFRNONAA 51 (L) 07/27/2016 0548   GFRAA 59 (L) 07/27/2016 0548     Assessment/Plan: 1 Day Post-Op   Principal Problem:   Subcapital fracture of femur (HCC) Active Problems:   Fall   Dementia   RA (rheumatoid arthritis) (HCC)   Decubitus ulcer of sacral region, stage 1   Left displaced femoral neck fracture (HCC)    WBAT with walker DVT ppx: lovenox x30 days, SCDs, TEDs PT/OT D/C planning    Emmerich Cryer, 07/29/2016 07/27/2016, 7:43 AM   07/29/2016, MD Cell [phone]

## 2016-07-27 NOTE — Clinical Social Work Note (Signed)
Clinical Social Work Assessment  Patient Details  Name: Paul Hansen MRN: [id number] Date of Birth: May 23, 1946  Date of referral:  07/27/16               Reason for consult:  Discharge Planning                Permission sought to share information with:  Facility Medical sales representative, Other (Legal Guardian is [place], [phone] or [phone].) Permission granted to share information::     Name::     [place], [phone] or [phone].  Agency::  SNF  Relationship::  Legal Guardian  Contact Information:     Housing/Transportation Living arrangements for the past 2 months:  Skilled Nursing Facility Source of Information:  Other (Comment Required) (Legal Guardian is [place], [phone] or [phone].) Patient Interpreter Needed:  None Criminal Activity/Legal Involvement Pertinent to Current Situation/Hospitalization:  No - Comment as needed Significant Relationships:  Other(Comment) (Legal Guardian is [place], [phone] or [phone].) Lives with:  Facility Resident Do you feel safe going back to the place where you live?  Yes Need for family participation in patient care:  Yes (Comment)  Care giving concerns:  The patient is from [facility]. Patient has legal guardian [place], [phone] or [phone]. Patient's legal guardian would like patient to return to [facility] once patient is medically stable.   Social Worker assessment / plan:  Patient's legal guardian would like the patient to return to [facility] once patient is medically stable. CSW will facilitate patient's discharge once medically ready.   Employment status:  Retired Database administrator PT Recommendations:  Skilled Nursing Facility Information / Referral to community resources:  Skilled Nursing Facility  Patient/Family's Response to care:  The patient's legal guardian  is happy with the care the patient has  received.   Patient/Family's Understanding of and Emotional Response to Diagnosis, Current Treatment, and Prognosis: The patient's legal guardian has a good understanding of why the patient was admitted.  She understands the care plan and what the patient will need post discharge. Emotional Assessment Appearance:   (Unable to assess.) Attitude/Demeanor/Rapport:  Unable to Assess Affect (typically observed):  Unable to Assess Orientation:   (unable to assess.) Alcohol / Substance use:  Not Applicable Psych involvement (Current and /or in the community):  No (Comment)  Discharge Needs  Concerns to be addressed:  Discharge Planning Concerns Readmission within the last 30 days:  Yes Current discharge risk:  Physical Impairment Barriers to Discharge:  Continued Medical Work up   [employer], LCSW 07/27/2016, 11:22 AM

## 2016-07-27 NOTE — Progress Notes (Signed)
[facility] PROGRESS NOTE  TZION WEDEL QIW:[id number] DOB: October 11, 1946 DOA: 07/25/2016   PCP: Dan Maker, MD     Assessment/Plan: Principal Problem:   Subcapital fracture of femur (HCC) Active Problems:   Fall   Dementia   RA (rheumatoid arthritis) (HCC)   Decubitus ulcer of sacral region, stage 1   70 y.o. male with medical  history of RA on methotrexate and prednisone, vascular dementia, bipolar disorder, prior CVA, and HTN who presents after falling this morning at the skilled nursing facility with complaints of left hip pain. Imaging studies revealed a left subcapital femur fracture.    Assessment and plan Recurrent Fall with left subcapital femur fracture: Recently discharged on 07/12/16 to SNF Acute. Risk factors include chronic steroid use. Telemetry uneventful, EKG within normal limits - Hip fracture order set initiated - pain control prn - Dr. Linna Caprice consulted in the ED, f/u with orthopedics   I do not see any obvious contraindications to surgery, although this would be a moderate risk procedure given other comorbidities such as prior history of CVA, age, hypertension  Status post Left hip hemiarthroplasty, anterior approach 9/27 Lovenox for 30 days for DVT prophylaxis    anemia of chronic disease-baseline around 12.5, hemoglobin around 9.3, Mild thrombocytopenia-monitor on Lovenox Repeat CBC tomorrow   Leukocytosis WBC 16.9 on admission. The differential includes infection(patient just recently treated for C. difficile infection during last hospitalization) versus chronic steroids versus reactive.UA, chest x-ray negative  Leukocytosis has now resolved   Chronic kidney disease stage III: Creatinine near baseline, but patient noted to have elevation in BUN to 28 suggesting possible signs of dehydration. - Gentle IV fluids  History of dementia and bipolar disorder - Continuing home medications of Aricept and Seroquel (reduced to 25 mg QHS, QTC  500> msec). Check magnesium. Repeat EKG shows QTC of  450. Patient does not have capacity to make medical decisions. As per RN report, patient is ward of the state. No agitation. Will consult social work   COPD without acute exacerbation  - continue Breo - Duonebs prn SOB/Wheezing  Essential hypertension  - continue metoprolol , blood pressure soft Suspect mild adrenal insufficiency, therefore the patient received 2 dose of IV Solu-Cortef and then resume prednisone   Elevated lipase: Mild elevation to 52. No significant abdominal pain noted on physical exam -  Continue to monitor Tobacco abuse - continue nicotine patch  Rheumatoid arthritis Holding methotrexate. Continue prednisone. Patient's medications need to be verified with his PCP and if on methotrexate prior to admission, may be resumed in a week's time.  BPH - continue finasteride  Right leg wound - Continue topical wound care as per wound care recommendations made today.  DVT prophylaxsis  Lovenox  Code Status:  Full code     Family Communication: Discussed in detail with the patient, all imaging results, lab results explained to the patient   Disposition Plan:  Anticipate discharge to SNF tomorrow      Consultants: Orthopedics  Procedures  none  Antibiotics   Start     Dose/Rate Route Frequency Ordered Stop   07/25/16 2145  cefTRIAXone (ROCEPHIN) 1 g in dextrose 5 % 50 mL IVPB  Status:  Discontinued     1 g 100 mL/hr over 30 Minutes Intravenous Every 24 hours 07/25/16 2138 07/25/16 2139         HPI/Subjective: Minimal pain in right leg  Objective: Vitals:   07/25/16 2115 07/25/16 2130 07/25/16 2303 07/26/16 0542  BP: 115/84 117/95 (!) 149/88 107/63  Pulse: 97 62 78 (!) 59  Resp: 25 25 (!) 22 18  Temp:   98.2 F (36.8 C) 97.9 F (36.6 C)  TempSrc:   Oral Oral  SpO2:  93% 91% 94%  Weight:   59.9 kg (132 lb 0.9 oz)   Height:        Intake/Output Summary (Last 24 hours) at  07/26/16 0802 Last data filed at 07/26/16 0600  Gross per 24 hour  Intake              950 ml  Output              475 ml  Net              475 ml    Exam:  Examination:  General exam: Appears calm and comfortable  Respiratory system: Clear to auscultation. Respiratory effort normal. Cardiovascular system: S1 & S2 heard, RRR. No JVD, murmurs, rubs, gallops or clicks. No pedal edema. Gastrointestinal system: Abdomen is nondistended, soft and nontender. No organomegaly or masses felt. Normal bowel sounds heard. Central nervous systeconfused at baseline No focal neurological deficits. Extremities: Symmetric 5 x 5 power. Skin: No rashes, lesions or ulcers Psychiatry: Judgement and insight appear normal. Mood & affect appropriate.     Data Reviewed: I have personally reviewed following labs and imaging studies  Micro Results Recent Results (from the past 240 hour(s))  MRSA PCR Screening     Status: None   Collection Time: 07/25/16 11:32 PM  Result Value Ref Range Status   MRSA by PCR NEGATIVE NEGATIVE Final    Comment:        The GeneXpert MRSA Assay (FDA approved for NASAL specimens only), is one component of a comprehensive MRSA colonization surveillance program. It is not intended to diagnose MRSA infection nor to guide or monitor treatment for MRSA infections.     Radiology Reports Dg Chest 1 View  Result Date: 07/25/2016 CLINICAL DATA:  Larey Seat this morning and injured left hip. EXAM: CHEST 1 VIEW COMPARISON:  07/07/2016 FINDINGS: The cardiac silhouette, mediastinal and hilar contours are within normal limits and stable. Moderate tortuosity of the thoracic aorta. Stable emphysematous changes and pulmonary scarring. No definite acute overlying pulmonary process. The bony thorax is intact. IMPRESSION: Emphysematous changes and pulmonary scarring but no acute overlying pulmonary findings. Electronically Signed   By: Rudie Meyer M.D.   On: 07/25/2016 15:56   Dg Forearm  Right  Result Date: 07/06/2016 CLINICAL DATA:  Larey Seat today.  Right forearm pain. EXAM: RIGHT FOREARM - 2 VIEW COMPARISON:  None. FINDINGS: The wrist and elbow joints are maintained. No acute forearm fracture. IMPRESSION: No acute fracture. Electronically Signed   By: Rudie Meyer M.D.   On: 07/06/2016 13:19   Ct Head Wo Contrast  Result Date: 07/25/2016 CLINICAL DATA:  Status post fall this morning with left-sided hip pain. Vomiting after fall. EXAM: CT HEAD WITHOUT CONTRAST CT CERVICAL SPINE WITHOUT CONTRAST TECHNIQUE: Multidetector CT imaging of the head and cervical spine was performed following the standard protocol without intravenous contrast. Multiplanar CT image reconstructions of the cervical spine were also generated. COMPARISON:  CT head and cervical spine dated 07/06/2016. FINDINGS: CT HEAD FINDINGS Brain: Again noted is generalized age related parenchymal atrophy with commensurate dilatation of the ventricles and sulci. Again noted are confluent areas of low density within the bilateral periventricular and subcortical white matter regions consistent with changes of chronic small vessel ischemia. Small old  lacunar infarcts again noted within the left basal ganglia. There is no mass, hemorrhage, edema or other evidence of acute parenchymal abnormality. No extra-axial hemorrhage. Vascular: There are chronic calcified atherosclerotic changes of the large vessels at the skull base. No unexpected hyperdense vessel. Skull: Normal. Negative for fracture or focal lesion. Sinuses/Orbits: No acute finding. Other: None CT CERVICAL SPINE FINDINGS Alignment: Dextroscoliosis of the lower cervical spine. Alignment otherwise normal. Skull base and vertebrae: No fracture line or displaced fracture fragment identified. Facet joints appear intact and normally aligned. Soft tissues and spinal canal: No prevertebral fluid or swelling. No visible canal hematoma. Disc levels: Mild degenerative change without significant  central canal stenosis. Upper chest: Severe emphysematous changes within each lung apex. Other: None IMPRESSION: 1. No acute intracranial abnormality. No intracranial mass, hemorrhage or edema. No skull fracture. Atrophy and chronic ischemic changes, as detailed above. 2. No fracture or acute subluxation within the cervical spine. Scoliosis and mild degenerative change. 3. Severe emphysematous changes within the lung apices. Electronically Signed   By: Bary Richard M.D.   On: 07/25/2016 16:16   Ct Head Wo Contrast  Result Date: 07/06/2016 CLINICAL DATA:  Fall last night. Laceration above the right eye. Initial encounter. EXAM: CT HEAD WITHOUT CONTRAST CT CERVICAL SPINE WITHOUT CONTRAST TECHNIQUE: Multidetector CT imaging of the head and cervical spine was performed following the standard protocol without intravenous contrast. Multiplanar CT image reconstructions of the cervical spine were also generated. COMPARISON:  Brain MRI 12/03/2009. FINDINGS: CT HEAD FINDINGS Brain: There is moderate cerebral atrophy with greatest involvement of the parietal lobes. 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 There is no evidence of acute cortical infarct, intracranial hemorrhage, mass, midline shift, or extra-axial fluid collection. Patchy to confluent cerebral white matter hypodensities are nonspecific but compatible with moderate chronic small vessel ischemic disease, advanced for age. Vascular: Calcified atherosclerosis at the skullbase. Skull: No fracture identified. Sinuses/Orbits: Visualized paranasal sinuses and mastoid air cells are clear. Orbits are unremarkable. Other: Mild supraorbital soft tissue swelling. CT CERVICAL SPINE FINDINGS Alignment: No evidence of traumatic subluxation. Skull base and vertebrae: No fracture or destructive osseous lesion. Soft tissues and spinal canal: No prevertebral edema. Mild right-sided cervical atherosclerosis. Disc levels:  Mild cervical spondylosis,  most notable at C6-7. Upper chest: Advanced centrilobular emphysema. Right greater than left apical lung scarring. Other: None. IMPRESSION: 1. No evidence of acute intracranial abnormality. 2. Mild right supraorbital soft tissue swelling. 3. Moderately advanced chronic small vessel ischemic disease. 4. No acute abnormality identified in the cervical spine. Electronically Signed   By: Sebastian Ache M.D.   On: 07/06/2016 13:51   Ct Cervical Spine Wo Contrast  Result Date: 07/25/2016 CLINICAL DATA:  Status post fall this morning with left-sided hip pain. Vomiting after fall. EXAM: CT HEAD WITHOUT CONTRAST CT CERVICAL SPINE WITHOUT CONTRAST TECHNIQUE: Multidetector CT imaging of the head and cervical spine was performed following the standard protocol without intravenous contrast. Multiplanar CT image reconstructions of the cervical spine were also generated. COMPARISON:  CT head and cervical spine dated 07/06/2016. FINDINGS: CT HEAD FINDINGS Brain: Again noted is generalized age related parenchymal atrophy with commensurate dilatation of the ventricles and sulci. Again noted are confluent areas of low density within the bilateral periventricular and subcortical white matter regions consistent with changes of chronic small vessel ischemia. Small old lacunar infarcts again noted within the left basal ganglia. There is no mass, hemorrhage, edema or other evidence of acute parenchymal abnormality. No extra-axial  hemorrhage. Vascular: There are chronic calcified atherosclerotic changes of the large vessels at the skull base. No unexpected hyperdense vessel. Skull: Normal. Negative for fracture or focal lesion. Sinuses/Orbits: No acute finding. Other: None CT CERVICAL SPINE FINDINGS Alignment: Dextroscoliosis of the lower cervical spine. Alignment otherwise normal. Skull base and vertebrae: No fracture line or displaced fracture fragment identified. Facet joints appear intact and normally aligned. Soft tissues and spinal  canal: No prevertebral fluid or swelling. No visible canal hematoma. Disc levels: Mild degenerative change without significant central canal stenosis. Upper chest: Severe emphysematous changes within each lung apex. Other: None IMPRESSION: 1. No acute intracranial abnormality. No intracranial mass, hemorrhage or edema. No skull fracture. Atrophy and chronic ischemic changes, as detailed above. 2. No fracture or acute subluxation within the cervical spine. Scoliosis and mild degenerative change. 3. Severe emphysematous changes within the lung apices. Electronically Signed   By: Bary Richard M.D.   On: 07/25/2016 16:16   Ct Cervical Spine Wo Contrast  Result Date: 07/06/2016 CLINICAL DATA:  Fall last night. Laceration above the right eye. Initial encounter. EXAM: CT HEAD WITHOUT CONTRAST CT CERVICAL SPINE WITHOUT CONTRAST TECHNIQUE: Multidetector CT imaging of the head and cervical spine was performed following the standard protocol without intravenous contrast. Multiplanar CT image reconstructions of the cervical spine were also generated. COMPARISON:  Brain MRI 12/03/2009. FINDINGS: CT HEAD FINDINGS Brain: There is moderate cerebral atrophy with greatest involvement of the parietal lobes. 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 There is no evidence of acute cortical infarct, intracranial hemorrhage, mass, midline shift, or extra-axial fluid collection. Patchy to confluent cerebral white matter hypodensities are nonspecific but compatible with moderate chronic small vessel ischemic disease, advanced for age. Vascular: Calcified atherosclerosis at the skullbase. Skull: No fracture identified. Sinuses/Orbits: Visualized paranasal sinuses and mastoid air cells are clear. Orbits are unremarkable. Other: Mild supraorbital soft tissue swelling. CT CERVICAL SPINE FINDINGS Alignment: No evidence of traumatic subluxation. Skull base and vertebrae: No fracture or destructive osseous lesion.  Soft tissues and spinal canal: No prevertebral edema. Mild right-sided cervical atherosclerosis. Disc levels:  Mild cervical spondylosis, most notable at C6-7. Upper chest: Advanced centrilobular emphysema. Right greater than left apical lung scarring. Other: None. IMPRESSION: 1. No evidence of acute intracranial abnormality. 2. Mild right supraorbital soft tissue swelling. 3. Moderately advanced chronic small vessel ischemic disease. 4. No acute abnormality identified in the cervical spine. Electronically Signed   By: Sebastian Ache M.D.   On: 07/06/2016 13:51   Dg Chest Port 1 View  Result Date: 07/07/2016 CLINICAL DATA:  Recent fall EXAM: PORTABLE CHEST 1 VIEW COMPARISON:  10/12/2015 FINDINGS: Cardiac shadow is within normal limits. The lungs are again hyperinflated. Some chronic scarring is noted in the bases bilaterally. No focal infiltrate or sizable effusion is seen. No acute bony abnormality is noted. IMPRESSION: No acute abnormality seen. Electronically Signed   By: Alcide Clever M.D.   On: 07/07/2016 08:52   Dg Knee Complete 4 Views Left  Result Date: 07/25/2016 CLINICAL DATA:  Acute onset of generalized left knee pain after fall. Initial encounter. EXAM: LEFT KNEE - COMPLETE 4+ VIEW COMPARISON:  None. FINDINGS: There is no evidence of fracture or dislocation. The joint spaces are preserved. No significant degenerative change is seen; the patellofemoral joint is grossly unremarkable in appearance. Trace knee joint fluid is within normal limits. A small enthesophyte is noted at the upper pole of the patella. Scattered vascular calcifications are seen. IMPRESSION: 1. No  evidence of fracture or dislocation. 2. Scattered vascular calcifications seen. Electronically Signed   By: Roanna RaiderJeffery  Chang M.D.   On: 07/25/2016 18:29   Dg Knee Complete 4 Views Right  Result Date: 07/06/2016 CLINICAL DATA:  Tripped over own feet and fell, weakness, anterior RIGHT laceration EXAM: RIGHT KNEE - COMPLETE 4+ VIEW  COMPARISON:  Non FINDINGS: Osseous demineralization. Joint space narrowing. No acute fracture, dislocation, or bone destruction. No knee joint effusion. Scattered vascular calcifications. IMPRESSION: Osseous demineralization with mild degenerative changes. No acute bony abnormalities identified. Electronically Signed   By: Ulyses SouthwardMark  Boles M.D.   On: 07/06/2016 13:21   Dg Hip Unilat W Or Wo Pelvis 2-3 Views Left  Result Date: 07/25/2016 CLINICAL DATA:  Fall this morning.  Left hip pain. EXAM: DG HIP (WITH OR WITHOUT PELVIS) 2-3V LEFT COMPARISON:  None. FINDINGS: Fracture of the proximal left femur with superior displacement of the distal component. This fracture is at the left femoral head and neck junction and compatible with a subcapital fracture. Prostate calcifications at the level of the pubic symphysis. Normal bowel gas pattern. Pubic rami are intact. Left femoral head is located. Right hip is grossly intact. IMPRESSION: Subcapital fracture in the left femur. Electronically Signed   By: Richarda OverlieAdam  Henn M.D.   On: 07/25/2016 15:57     CBC  Recent Labs Lab 07/25/16 1612 07/26/16 0429  WBC 16.9* 15.7*  HGB 12.3* 11.5*  HCT 39.4 36.2*  PLT 202 164  MCV 94.9 94.0  MCH 29.6 29.9  MCHC 31.2 31.8  RDW 19.5* 19.3*  LYMPHSABS 1.5  --   MONOABS 0.6  --   EOSABS 0.0  --   BASOSABS 0.0  --     Chemistries   Recent Labs Lab 07/25/16 1612 07/26/16 0429  NA 136 139  K 4.3 5.5*  CL 109 109  CO2 20* 22  GLUCOSE 154* 118*  BUN 28* 24*  CREATININE 1.35* 1.26*  CALCIUM 8.9 8.8*  AST 26  --   ALT 19  --   ALKPHOS 88  --   BILITOT 0.6  --    ------------------------------------------------------------------------------------------------------------------ estimated creatinine clearance is 45.6 mL/min (by C-G formula based on SCr of 1.26 mg/dL (H)). ------------------------------------------------------------------------------------------------------------------ No results for input(s): HGBA1C in  the last 72 hours. ------------------------------------------------------------------------------------------------------------------ No results for input(s): CHOL, HDL, LDLCALC, TRIG, CHOLHDL, LDLDIRECT in the last 72 hours. ------------------------------------------------------------------------------------------------------------------ No results for input(s): TSH, T4TOTAL, T3FREE, THYROIDAB in the last 72 hours.  Invalid input(s): FREET3 ------------------------------------------------------------------------------------------------------------------ No results for input(s): VITAMINB12, FOLATE, FERRITIN, TIBC, IRON, RETICCTPCT in the last 72 hours.  Coagulation profile No results for input(s): INR, PROTIME in the last 168 hours.  No results for input(s): DDIMER in the last 72 hours.  Cardiac Enzymes No results for input(s): CKMB, TROPONINI, MYOGLOBIN in the last 168 hours.  Invalid input(s): CK ------------------------------------------------------------------------------------------------------------------ Invalid input(s): POCBNP   CBG: No results for input(s): GLUCAP in the last 168 hours.     Studies: Dg Chest 1 View  Result Date: 07/25/2016 CLINICAL DATA:  Larey SeatFell this morning and injured left hip. EXAM: CHEST 1 VIEW COMPARISON:  07/07/2016 FINDINGS: The cardiac silhouette, mediastinal and hilar contours are within normal limits and stable. Moderate tortuosity of the thoracic aorta. Stable emphysematous changes and pulmonary scarring. No definite acute overlying pulmonary process. The bony thorax is intact. IMPRESSION: Emphysematous changes and pulmonary scarring but no acute overlying pulmonary findings. Electronically Signed   By: Rudie MeyerP.  Gallerani M.D.   On: 07/25/2016 15:56   Ct Head  Wo Contrast  Result Date: 07/25/2016 CLINICAL DATA:  Status post fall this morning with left-sided hip pain. Vomiting after fall. EXAM: CT HEAD WITHOUT CONTRAST CT CERVICAL SPINE WITHOUT  CONTRAST TECHNIQUE: Multidetector CT imaging of the head and cervical spine was performed following the standard protocol without intravenous contrast. Multiplanar CT image reconstructions of the cervical spine were also generated. COMPARISON:  CT head and cervical spine dated 07/06/2016. FINDINGS: CT HEAD FINDINGS Brain: Again noted is generalized age related parenchymal atrophy with commensurate dilatation of the ventricles and sulci. Again noted are confluent areas of low density within the bilateral periventricular and subcortical white matter regions consistent with changes of chronic small vessel ischemia. Small old lacunar infarcts again noted within the left basal ganglia. There is no mass, hemorrhage, edema or other evidence of acute parenchymal abnormality. No extra-axial hemorrhage. Vascular: There are chronic calcified atherosclerotic changes of the large vessels at the skull base. No unexpected hyperdense vessel. Skull: Normal. Negative for fracture or focal lesion. Sinuses/Orbits: No acute finding. Other: None CT CERVICAL SPINE FINDINGS Alignment: Dextroscoliosis of the lower cervical spine. Alignment otherwise normal. Skull base and vertebrae: No fracture line or displaced fracture fragment identified. Facet joints appear intact and normally aligned. Soft tissues and spinal canal: No prevertebral fluid or swelling. No visible canal hematoma. Disc levels: Mild degenerative change without significant central canal stenosis. Upper chest: Severe emphysematous changes within each lung apex. Other: None IMPRESSION: 1. No acute intracranial abnormality. No intracranial mass, hemorrhage or edema. No skull fracture. Atrophy and chronic ischemic changes, as detailed above. 2. No fracture or acute subluxation within the cervical spine. Scoliosis and mild degenerative change. 3. Severe emphysematous changes within the lung apices. Electronically Signed   By: Bary Richard M.D.   On: 07/25/2016 16:16   Ct  Cervical Spine Wo Contrast  Result Date: 07/25/2016 CLINICAL DATA:  Status post fall this morning with left-sided hip pain. Vomiting after fall. EXAM: CT HEAD WITHOUT CONTRAST CT CERVICAL SPINE WITHOUT CONTRAST TECHNIQUE: Multidetector CT imaging of the head and cervical spine was performed following the standard protocol without intravenous contrast. Multiplanar CT image reconstructions of the cervical spine were also generated. COMPARISON:  CT head and cervical spine dated 07/06/2016. FINDINGS: CT HEAD FINDINGS Brain: Again noted is generalized age related parenchymal atrophy with commensurate dilatation of the ventricles and sulci. Again noted are confluent areas of low density within the bilateral periventricular and subcortical white matter regions consistent with changes of chronic small vessel ischemia. Small old lacunar infarcts again noted within the left basal ganglia. There is no mass, hemorrhage, edema or other evidence of acute parenchymal abnormality. No extra-axial hemorrhage. Vascular: There are chronic calcified atherosclerotic changes of the large vessels at the skull base. No unexpected hyperdense vessel. Skull: Normal. Negative for fracture or focal lesion. Sinuses/Orbits: No acute finding. Other: None CT CERVICAL SPINE FINDINGS Alignment: Dextroscoliosis of the lower cervical spine. Alignment otherwise normal. Skull base and vertebrae: No fracture line or displaced fracture fragment identified. Facet joints appear intact and normally aligned. Soft tissues and spinal canal: No prevertebral fluid or swelling. No visible canal hematoma. Disc levels: Mild degenerative change without significant central canal stenosis. Upper chest: Severe emphysematous changes within each lung apex. Other: None IMPRESSION: 1. No acute intracranial abnormality. No intracranial mass, hemorrhage or edema. No skull fracture. Atrophy and chronic ischemic changes, as detailed above. 2. No fracture or acute subluxation  within the cervical spine. Scoliosis and mild degenerative change. 3. Severe emphysematous changes within the  lung apices. Electronically Signed   By: Bary Richard M.D.   On: 07/25/2016 16:16   Dg Knee Complete 4 Views Left  Result Date: 07/25/2016 CLINICAL DATA:  Acute onset of generalized left knee pain after fall. Initial encounter. EXAM: LEFT KNEE - COMPLETE 4+ VIEW COMPARISON:  None. FINDINGS: There is no evidence of fracture or dislocation. The joint spaces are preserved. No significant degenerative change is seen; the patellofemoral joint is grossly unremarkable in appearance. Trace knee joint fluid is within normal limits. A small enthesophyte is noted at the upper pole of the patella. Scattered vascular calcifications are seen. IMPRESSION: 1. No evidence of fracture or dislocation. 2. Scattered vascular calcifications seen. Electronically Signed   By: Roanna Raider M.D.   On: 07/25/2016 18:29   Dg Hip Unilat W Or Wo Pelvis 2-3 Views Left  Result Date: 07/25/2016 CLINICAL DATA:  Fall this morning.  Left hip pain. EXAM: DG HIP (WITH OR WITHOUT PELVIS) 2-3V LEFT COMPARISON:  None. FINDINGS: Fracture of the proximal left femur with superior displacement of the distal component. This fracture is at the left femoral head and neck junction and compatible with a subcapital fracture. Prostate calcifications at the level of the pubic symphysis. Normal bowel gas pattern. Pubic rami are intact. Left femoral head is located. Right hip is grossly intact. IMPRESSION: Subcapital fracture in the left femur. Electronically Signed   By: Richarda Overlie M.D.   On: 07/25/2016 15:57      No results found for: HGBA1C Lab Results  Component Value Date   CREATININE 1.26 (H) 07/26/2016       Scheduled Meds: . donepezil  10 mg Oral BH-q7a  . finasteride  5 mg Oral Daily  . fluticasone furoate-vilanterol  1 puff Inhalation Daily  . folic acid  1 mg Oral Daily  . metoprolol  25 mg Oral BID  . nicotine  14 mg  Transdermal Daily  . predniSONE  5 mg Oral Daily  . QUEtiapine  50 mg Oral QHS  . saccharomyces boulardii  250 mg Oral BID  . senna-docusate  1 tablet Oral QHS   Continuous Infusions: . sodium chloride 75 mL/hr at 07/26/16 0500     LOS: 1 day    Time spent: >30 MINS    [facility]  [facility] Pager [phone]. If 7PM-7AM, please contact night-coverage at [email or web address], password [facility] 07/26/2016, 8:02 AM  LOS: 1 day

## 2016-07-27 NOTE — Progress Notes (Signed)
Pt must stay on enteric precautions per ID

## 2016-07-27 NOTE — Evaluation (Addendum)
Physical Therapy Evaluation Patient Details Name: Paul Hansen MRN: [id number] DOB: Oct 31, 1945 Today's Date: 07/27/2016   History of Present Illness  70 y.o. male with medical  history of RA on methotrexate and prednisone, vascular dementia, bipolar disorder, prior CVA, and HTN who presents after falling at the skilled nursing facility and suffered a left subcapital femur fracture. Pt is now s/p L hip anterior hemiarthroplasty (07/26/16)  Clinical Impression  Pt admitted with above diagnosis. Pt currently with functional limitations due to the deficits listed below (see PT Problem List).  Pt will benefit from skilled PT to increase their independence and safety with mobility to allow discharge to the venue listed below.  Recommend returning to SNF.     Follow Up Recommendations SNF;Supervision for mobility/OOB;Supervision/Assistance - 24 hour    Equipment Recommendations  None recommended by PT    Recommendations for Other Services       Precautions / Restrictions Precautions Precautions: Fall Restrictions Weight Bearing Restrictions: Yes LLE Weight Bearing: Weight bearing as tolerated      Mobility  Bed Mobility Overal bed mobility: Needs Assistance       Supine to sit: Min assist     General bed mobility comments: A for L LE, but pt able to use UE well to move self to EOB  Transfers Overall transfer level: Needs assistance Equipment used: Rolling walker (2 wheeled) Transfers: Sit to/from [employer] Sit to Stand: Min assist Stand pivot transfers: Min assist       General transfer comment: MIN A to power up and for safety with SPT with L LE WB.  Would recommend chair follow with gait due to dementia and his impulsivness.  Ambulation/Gait                Stairs            Wheelchair Mobility    Modified Rankin (Stroke Patients Only)       Balance Overall balance assessment: History of Falls                                            Pertinent Vitals/Pain Pain Assessment: Faces Faces Pain Scale: Hurts little more Pain Location: L knee Pain Intervention(s): Monitored during session;Repositioned    Home Living Family/patient expects to be discharged to:: Skilled nursing facility                 Additional Comments: Lived in apartment alone, but just went to SNF at last hospital d/c 2 weeks ago per chart.  Pt is not a reliable historian due to dementia.     Prior Function           Comments: During last admission 2 weeks ago he was transferring with MOD A and ambulating 20' with RW     Hand Dominance        Extremity/Trunk Assessment   Upper Extremity Assessment: Overall WFL for tasks assessed           Lower Extremity Assessment: Overall WFL for tasks assessed;Generalized weakness         Communication   Communication: No difficulties  Cognition Arousal/Alertness: Awake/alert Behavior During Therapy: WFL for tasks assessed/performed Overall Cognitive Status: History of cognitive impairments - at baseline                      General Comments  Exercises Total Joint Exercises Ankle Circles/Pumps: AROM;Both;Supine Heel Slides: AROM;Left;10 reps;Supine Hip ABduction/ADduction: AAROM;Left;10 reps   Assessment/Plan    PT Assessment Patient needs continued PT services  PT Problem List Decreased strength;Decreased range of motion;Decreased balance;Decreased mobility;Decreased knowledge of use of DME;Decreased safety awareness          PT Treatment Interventions Gait training;Functional mobility training;Balance training;Therapeutic exercise;Therapeutic activities;DME instruction    PT Goals (Current goals can be found in the Care Plan section)  Acute Rehab PT Goals Patient Stated Goal: none stated PT Goal Formulation: With patient Time For Goal Achievement: 08/03/16 Potential to Achieve Goals: Fair    Frequency Min 3X/week   Barriers to  discharge        Co-evaluation               End of Session Equipment Utilized During Treatment: Gait belt Activity Tolerance: Patient tolerated treatment well Patient left: in chair;with call bell/phone within reach;with chair alarm set;Other (comment) (door left open and pt with table in front of him awaiting lunch) Nurse Communication: Mobility status         Time: 0258-5277 PT Time Calculation (min) (ACUTE ONLY): 19 min   Charges:   PT Evaluation $PT Eval Moderate Complexity: 1 Procedure PT Treatments $Therapeutic Activity: 8-22 mins   PT G Codes:        Emile Kyllo LUBECK 07/27/2016, 1:01 PM

## 2016-07-28 ENCOUNTER — Encounter (HOSPITAL_COMMUNITY): Payer: Self-pay | Admitting: Orthopedic Surgery

## 2016-07-28 DIAGNOSIS — R262 Difficulty in walking, not elsewhere classified: Secondary | ICD-10-CM

## 2016-07-28 LAB — CBC
HCT: 28.1 % — ABNORMAL LOW (ref 39.0–52.0)
HEMOGLOBIN: 8.9 g/dL — AB (ref 13.0–17.0)
MCH: 29.8 pg (ref 26.0–34.0)
MCHC: 31.7 g/dL (ref 30.0–36.0)
MCV: 94 fL (ref 78.0–100.0)
Platelets: 139 10*3/uL — ABNORMAL LOW (ref 150–400)
RBC: 2.99 MIL/uL — AB (ref 4.22–5.81)
RDW: 18.4 % — ABNORMAL HIGH (ref 11.5–15.5)
WBC: 11.3 10*3/uL — ABNORMAL HIGH (ref 4.0–10.5)

## 2016-07-28 MED ORDER — METHOCARBAMOL 500 MG PO TABS: 500.0000 mg | ORAL_TABLET | Freq: Four times a day (QID) | ORAL | 0 refills | Status: AC | PRN

## 2016-07-28 MED ORDER — SENNOSIDES-DOCUSATE SODIUM 8.6-50 MG PO TABS: 1.0000 | ORAL_TABLET | Freq: Every day | ORAL | 1 refills | Status: AC

## 2016-07-28 MED ORDER — TRAMADOL HCL 50 MG PO TABS: 50.0000 mg | ORAL_TABLET | Freq: Four times a day (QID) | ORAL | 0 refills | Status: AC | PRN

## 2016-07-28 MED ORDER — ENOXAPARIN SODIUM 40 MG/0.4ML ~~LOC~~ SOLN
40.0000 mg | SUBCUTANEOUS | 0 refills | Status: AC
Start: 2016-07-29 — End: 2016-08-28

## 2016-07-28 MED ORDER — POLYETHYLENE GLYCOL 3350 17 G PO PACK: 17.0000 g | PACK | Freq: Every day | ORAL | 0 refills | Status: AC | PRN

## 2016-07-28 NOTE — Discharge Summary (Signed)
Physician Discharge Summary  Paul Hansen MRN: [id number] DOB/AGE: [birth year] 70 y.o.  PCP: Myrtis Hopping, MD   Admit date: 07/25/2016 Discharge date: 07/28/2016  Discharge Diagnoses:    Principal Problem:   Subcapital fracture of femur [facility]) Active Problems:   Fall   Dementia   RA (rheumatoid arthritis) ([place])   Decubitus ulcer of sacral region, stage 1   Left displaced femoral neck fracture ([facility])   Fracture   Difficulty in walking, not elsewhere classified    Follow-up recommendations Follow-up with PCP in 3-5 days , including all  additional recommended appointments as below Follow-up CBC, CMP in 3-5 days WBAT with walker DVT ppx: lovenox x30 days,       Current Discharge Medication List    START taking these medications   Details  enoxaparin (LOVENOX) 40 MG/0.4ML injection Inject 0.4 mLs (40 mg total) into the skin daily. Qty: 30 Syringe, Refills: 0    methocarbamol (ROBAXIN) 500 MG tablet Take 1 tablet (500 mg total) by mouth every 6 (six) hours as needed for muscle spasms. Qty: 20 tablet, Refills: 0    polyethylene glycol (MIRALAX / GLYCOLAX) packet Take 17 g by mouth daily as needed for mild constipation. Qty: 14 each, Refills: 0    senna-docusate (SENOKOT-S) 8.6-50 MG tablet Take 1 tablet by mouth at bedtime. Qty: 30 tablet, Refills: 1    traMADol (ULTRAM) 50 MG tablet Take 1 tablet (50 mg total) by mouth every 6 (six) hours as needed. Qty: 30 tablet, Refills: 0      CONTINUE these medications which have NOT CHANGED   Details  donepezil (ARICEPT) 10 MG tablet Take 10 mg by mouth every morning.     finasteride (PROSCAR) 5 MG tablet Take 5 mg by mouth daily.    fluticasone furoate-vilanterol (BREO ELLIPTA) 100-25 MCG/INH AEPB Inhale 1 puff into the lungs daily.    folic acid (FOLVITE) 1 MG tablet Take 1 mg by mouth daily.    metoprolol (LOPRESSOR) 50 MG tablet Take 25 mg by mouth 2 (two) times daily.     nicotine (NICODERM CQ - DOSED IN MG/24  HOURS) 14 mg/24hr patch Place 1 patch (14 mg total) onto the skin daily.    predniSONE (DELTASONE) 5 MG tablet Take 5 mg by mouth daily.    QUEtiapine (SEROQUEL) 50 MG tablet Take 1 tablet (50 mg total) by mouth at bedtime.    saccharomyces boulardii (FLORASTOR) 250 MG capsule Take 1 capsule (250 mg total) by mouth 2 (two) times daily.    collagenase (SANTYL) ointment Apply topically daily. Apply Santyl to right outer leg wound Q day, then cover with moist fluffed gauze and foam dressing.  (Change foam dressing Q 5 days or PRN soiling.)    feeding supplement (BOOST / RESOURCE BREEZE) LIQD Take 1 Container by mouth 3 (three) times daily between meals.    nystatin (MYCOSTATIN/NYSTOP) powder Apply topically 2 (two) times daily. Apply to inguinal folds    vancomycin (VANCOCIN) 50 mg/mL oral solution Take 2.5 mLs (125 mg total) by mouth 4 (four) times daily. Discontinue after 07/20/16 doses.         Discharge Condition: stable  Discharge Instructions Get Medicines reviewed and adjusted: Please take all your medications with you for your next visit with your Primary MD  Please request your Primary MD to go over all hospital tests and procedure/radiological results at the follow up, please ask your Primary MD to get all Hospital records sent to his/her office.  If you  experience worsening of your admission symptoms, develop shortness of breath, life threatening emergency, suicidal or homicidal thoughts you must seek medical attention immediately by calling 911 or calling your MD immediately if symptoms less severe.  You must read complete instructions/literature along with all the possible adverse reactions/side effects for all the Medicines you take and that have been prescribed to you. Take any new Medicines after you have completely understood and accpet all the possible adverse reactions/side effects.   Do not drive when taking Pain medications.   Do not take more than prescribed Pain,  Sleep and Anxiety Medications  Special Instructions: If you have smoked or chewed Tobacco in the last 2 yrs please stop smoking, stop any regular Alcohol and or any Recreational drug use.  Wear Seat belts while driving.  Please note  You were cared for by a hospitalist during your hospital stay. Once you are discharged, your primary care physician will handle any further medical issues. Please note that NO REFILLS for any discharge medications will be authorized once you are discharged, as it is imperative that you return to your primary care physician (or establish a relationship with a primary care physician if you do not have one) for your aftercare needs so that they can reassess your need for medications and monitor your lab values.  Discharge Instructions    Diet - low sodium heart healthy    Complete by:  As directed    Increase activity slowly    Complete by:  As directed        No Known Allergies    Disposition: 03-Skilled Nursing Facility   Consults: Rod Can, MD     Significant Diagnostic Studies:  Dg Chest 1 View  Result Date: 07/25/2016 CLINICAL DATA:  [place] this morning and injured left hip. EXAM: CHEST 1 VIEW COMPARISON:  07/07/2016 FINDINGS: The cardiac silhouette, mediastinal and hilar contours are within normal limits and stable. Moderate tortuosity of the thoracic aorta. Stable emphysematous changes and pulmonary scarring. No definite acute overlying pulmonary process. The bony thorax is intact. IMPRESSION: Emphysematous changes and pulmonary scarring but no acute overlying pulmonary findings. Electronically Signed   By: Marijo Sanes M.D.   On: 07/25/2016 15:56   Dg Forearm Right  Result Date: 07/06/2016 CLINICAL DATA:  [place] today.  Right forearm pain. EXAM: RIGHT FOREARM - 2 VIEW COMPARISON:  None. FINDINGS: The wrist and elbow joints are maintained. No acute forearm fracture. IMPRESSION: No acute fracture. Electronically Signed   By: Marijo Sanes  M.D.   On: 07/06/2016 13:19   Ct Head Wo Contrast  Result Date: 07/25/2016 CLINICAL DATA:  Status post fall this morning with left-sided hip pain. Vomiting after fall. EXAM: CT HEAD WITHOUT CONTRAST CT CERVICAL SPINE WITHOUT CONTRAST TECHNIQUE: Multidetector CT imaging of the head and cervical spine was performed following the standard protocol without intravenous contrast. Multiplanar CT image reconstructions of the cervical spine were also generated. COMPARISON:  CT head and cervical spine dated 07/06/2016. FINDINGS: CT HEAD FINDINGS Brain: Again noted is generalized age related parenchymal atrophy with commensurate dilatation of the ventricles and sulci. Again noted are confluent areas of low density within the bilateral periventricular and subcortical white matter regions consistent with changes of chronic small vessel ischemia. Small old lacunar infarcts again noted within the left basal ganglia. There is no mass, hemorrhage, edema or other evidence of acute parenchymal abnormality. No extra-axial hemorrhage. Vascular: There are chronic calcified atherosclerotic changes of the large vessels at the skull base. No  unexpected hyperdense vessel. Skull: Normal. Negative for fracture or focal lesion. Sinuses/Orbits: No acute finding. Other: None CT CERVICAL SPINE FINDINGS Alignment: Dextroscoliosis of the lower cervical spine. Alignment otherwise normal. Skull base and vertebrae: No fracture line or displaced fracture fragment identified. Facet joints appear intact and normally aligned. Soft tissues and spinal canal: No prevertebral fluid or swelling. No visible canal hematoma. Disc levels: Mild degenerative change without significant central canal stenosis. Upper chest: Severe emphysematous changes within each lung apex. Other: None IMPRESSION: 1. No acute intracranial abnormality. No intracranial mass, hemorrhage or edema. No skull fracture. Atrophy and chronic ischemic changes, as detailed above. 2. No  fracture or acute subluxation within the cervical spine. Scoliosis and mild degenerative change. 3. Severe emphysematous changes within the lung apices. Electronically Signed   By: Franki Cabot M.D.   On: 07/25/2016 16:16   Ct Head Wo Contrast  Result Date: 07/06/2016 CLINICAL DATA:  Fall last night. Laceration above the right eye. Initial encounter. EXAM: CT HEAD WITHOUT CONTRAST CT CERVICAL SPINE WITHOUT CONTRAST TECHNIQUE: Multidetector CT imaging of the head and cervical spine was performed following the standard protocol without intravenous contrast. Multiplanar CT image reconstructions of the cervical spine were also generated. COMPARISON:  Brain MRI 12/03/2009. FINDINGS: CT HEAD FINDINGS Brain: There is moderate cerebral atrophy with greatest involvement of the parietal lobes. 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 There is no evidence of acute cortical infarct, intracranial hemorrhage, mass, midline shift, or extra-axial fluid collection. Patchy to confluent cerebral white matter hypodensities are nonspecific but compatible with moderate chronic small vessel ischemic disease, advanced for age. Vascular: Calcified atherosclerosis at the skullbase. Skull: No fracture identified. Sinuses/Orbits: Visualized paranasal sinuses and mastoid air cells are clear. Orbits are unremarkable. Other: Mild supraorbital soft tissue swelling. CT CERVICAL SPINE FINDINGS Alignment: No evidence of traumatic subluxation. Skull base and vertebrae: No fracture or destructive osseous lesion. Soft tissues and spinal canal: No prevertebral edema. Mild right-sided cervical atherosclerosis. Disc levels:  Mild cervical spondylosis, most notable at C6-7. Upper chest: Advanced centrilobular emphysema. Right greater than left apical lung scarring. Other: None. IMPRESSION: 1. No evidence of acute intracranial abnormality. 2. Mild right supraorbital soft tissue swelling. 3. Moderately advanced chronic small  vessel ischemic disease. 4. No acute abnormality identified in the cervical spine. Electronically Signed   By: Logan Bores M.D.   On: 07/06/2016 13:51   Ct Cervical Spine Wo Contrast  Result Date: 07/25/2016 CLINICAL DATA:  Status post fall this morning with left-sided hip pain. Vomiting after fall. EXAM: CT HEAD WITHOUT CONTRAST CT CERVICAL SPINE WITHOUT CONTRAST TECHNIQUE: Multidetector CT imaging of the head and cervical spine was performed following the standard protocol without intravenous contrast. Multiplanar CT image reconstructions of the cervical spine were also generated. COMPARISON:  CT head and cervical spine dated 07/06/2016. FINDINGS: CT HEAD FINDINGS Brain: Again noted is generalized age related parenchymal atrophy with commensurate dilatation of the ventricles and sulci. Again noted are confluent areas of low density within the bilateral periventricular and subcortical white matter regions consistent with changes of chronic small vessel ischemia. Small old lacunar infarcts again noted within the left basal ganglia. There is no mass, hemorrhage, edema or other evidence of acute parenchymal abnormality. No extra-axial hemorrhage. Vascular: There are chronic calcified atherosclerotic changes of the large vessels at the skull base. No unexpected hyperdense vessel. Skull: Normal. Negative for fracture or focal lesion. Sinuses/Orbits: No acute finding. Other: None CT CERVICAL SPINE FINDINGS Alignment: Dextroscoliosis of  the lower cervical spine. Alignment otherwise normal. Skull base and vertebrae: No fracture line or displaced fracture fragment identified. Facet joints appear intact and normally aligned. Soft tissues and spinal canal: No prevertebral fluid or swelling. No visible canal hematoma. Disc levels: Mild degenerative change without significant central canal stenosis. Upper chest: Severe emphysematous changes within each lung apex. Other: None IMPRESSION: 1. No acute intracranial  abnormality. No intracranial mass, hemorrhage or edema. No skull fracture. Atrophy and chronic ischemic changes, as detailed above. 2. No fracture or acute subluxation within the cervical spine. Scoliosis and mild degenerative change. 3. Severe emphysematous changes within the lung apices. Electronically Signed   By: Franki Cabot M.D.   On: 07/25/2016 16:16   Ct Cervical Spine Wo Contrast  Result Date: 07/06/2016 CLINICAL DATA:  Fall last night. Laceration above the right eye. Initial encounter. EXAM: CT HEAD WITHOUT CONTRAST CT CERVICAL SPINE WITHOUT CONTRAST TECHNIQUE: Multidetector CT imaging of the head and cervical spine was performed following the standard protocol without intravenous contrast. Multiplanar CT image reconstructions of the cervical spine were also generated. COMPARISON:  Brain MRI 12/03/2009. FINDINGS: CT HEAD FINDINGS Brain: There is moderate cerebral atrophy with greatest involvement of the parietal lobes. 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 There is no evidence of acute cortical infarct, intracranial hemorrhage, mass, midline shift, or extra-axial fluid collection. Patchy to confluent cerebral white matter hypodensities are nonspecific but compatible with moderate chronic small vessel ischemic disease, advanced for age. Vascular: Calcified atherosclerosis at the skullbase. Skull: No fracture identified. Sinuses/Orbits: Visualized paranasal sinuses and mastoid air cells are clear. Orbits are unremarkable. Other: Mild supraorbital soft tissue swelling. CT CERVICAL SPINE FINDINGS Alignment: No evidence of traumatic subluxation. Skull base and vertebrae: No fracture or destructive osseous lesion. Soft tissues and spinal canal: No prevertebral edema. Mild right-sided cervical atherosclerosis. Disc levels:  Mild cervical spondylosis, most notable at C6-7. Upper chest: Advanced centrilobular emphysema. Right greater than left apical lung scarring. Other: None.  IMPRESSION: 1. No evidence of acute intracranial abnormality. 2. Mild right supraorbital soft tissue swelling. 3. Moderately advanced chronic small vessel ischemic disease. 4. No acute abnormality identified in the cervical spine. Electronically Signed   By: Logan Bores M.D.   On: 07/06/2016 13:51   Pelvis Portable  Result Date: 07/26/2016 CLINICAL DATA:  Status post left hip arthroplasty. EXAM: PORTABLE PELVIS 1-2 VIEWS COMPARISON:  None FINDINGS: The hardware components of a left hip hemiarthroplasty device are identified. No periprosthetic fracture or dislocation identified. The hardware components appear to be in anatomic alignment. IMPRESSION: 1. Status post left hip arthroplasty. Electronically Signed   By: Kerby Moors M.D.   On: 07/26/2016 23:18   Dg Chest Port 1 View  Result Date: 07/07/2016 CLINICAL DATA:  Recent fall EXAM: PORTABLE CHEST 1 VIEW COMPARISON:  10/12/2015 FINDINGS: Cardiac shadow is within normal limits. The lungs are again hyperinflated. Some chronic scarring is noted in the bases bilaterally. No focal infiltrate or sizable effusion is seen. No acute bony abnormality is noted. IMPRESSION: No acute abnormality seen. Electronically Signed   By: Inez Catalina M.D.   On: 07/07/2016 08:52   Dg Knee Complete 4 Views Left  Result Date: 07/25/2016 CLINICAL DATA:  Acute onset of generalized left knee pain after fall. Initial encounter. EXAM: LEFT KNEE - COMPLETE 4+ VIEW COMPARISON:  None. FINDINGS: There is no evidence of fracture or dislocation. The joint spaces are preserved. No significant degenerative change is seen; the patellofemoral joint is grossly  unremarkable in appearance. Trace knee joint fluid is within normal limits. A small enthesophyte is noted at the upper pole of the patella. Scattered vascular calcifications are seen. IMPRESSION: 1. No evidence of fracture or dislocation. 2. Scattered vascular calcifications seen. Electronically Signed   By: Garald Balding M.D.   On:  07/25/2016 18:29   Dg Knee Complete 4 Views Right  Result Date: 07/06/2016 CLINICAL DATA:  Tripped over own feet and fell, weakness, anterior RIGHT laceration EXAM: RIGHT KNEE - COMPLETE 4+ VIEW COMPARISON:  Non FINDINGS: Osseous demineralization. Joint space narrowing. No acute fracture, dislocation, or bone destruction. No knee joint effusion. Scattered vascular calcifications. IMPRESSION: Osseous demineralization with mild degenerative changes. No acute bony abnormalities identified. Electronically Signed   By: Lavonia Dana M.D.   On: 07/06/2016 13:21   Dg C-arm 61-120 Min  Result Date: 07/26/2016 CLINICAL DATA:  Portable operative imaging for left hip arthroplasty. EXAM: DG C-ARM 61-120 MIN; OPERATIVE LEFT HIP WITH PELVIS COMPARISON:  None. FINDINGS: New left hip hemiarthroplasty appears well-seated and aligned provided images. There is no acute fracture or evidence of an operative complication. IMPRESSION: Well-positioned left hip hemiarthroplasty. Electronically Signed   By: Lajean Manes M.D.   On: 07/26/2016 21:46   Dg Hip Operative Unilat W Or W/o Pelvis Left  Result Date: 07/26/2016 CLINICAL DATA:  Portable operative imaging for left hip arthroplasty. EXAM: DG C-ARM 61-120 MIN; OPERATIVE LEFT HIP WITH PELVIS COMPARISON:  None. FINDINGS: New left hip hemiarthroplasty appears well-seated and aligned provided images. There is no acute fracture or evidence of an operative complication. IMPRESSION: Well-positioned left hip hemiarthroplasty. Electronically Signed   By: Lajean Manes M.D.   On: 07/26/2016 21:46   Dg Hip Unilat W Or Wo Pelvis 2-3 Views Left  Result Date: 07/25/2016 CLINICAL DATA:  Fall this morning.  Left hip pain. EXAM: DG HIP (WITH OR WITHOUT PELVIS) 2-3V LEFT COMPARISON:  None. FINDINGS: Fracture of the proximal left femur with superior displacement of the distal component. This fracture is at the left femoral head and neck junction and compatible with a subcapital fracture.  Prostate calcifications at the level of the pubic symphysis. Normal bowel gas pattern. Pubic rami are intact. Left femoral head is located. Right hip is grossly intact. IMPRESSION: Subcapital fracture in the left femur. Electronically Signed   By: Markus Daft M.D.   On: 07/25/2016 15:57        Filed Weights   07/25/16 1438 07/25/16 2303  Weight: 52.2 kg (115 lb) 59.9 kg (132 lb 0.9 oz)     Microbiology: Recent Results (from the past 240 hour(s))  MRSA PCR Screening     Status: None   Collection Time: 07/25/16 11:32 PM  Result Value Ref Range Status   MRSA by PCR NEGATIVE NEGATIVE Final    Comment:        The GeneXpert MRSA Assay (FDA approved for NASAL specimens only), is one component of a comprehensive MRSA colonization surveillance program. It is not intended to diagnose MRSA infection nor to guide or monitor treatment for MRSA infections.        Blood Culture    Component Value Date/Time   SDES URINE, RANDOM 07/06/2016 1627   SPECREQUEST NONE 07/06/2016 1627   CULT <10,000 COLONIES/mL INSIGNIFICANT GROWTH (A) 07/06/2016 1627   REPTSTATUS 07/07/2016 FINAL 07/06/2016 1627      Labs: Results for orders placed or performed during the hospital encounter of 07/25/16 (from the past 48 hour(s))  Comprehensive metabolic panel  Status: Abnormal   Collection Time: 07/27/16  5:48 AM  Result Value Ref Range   Sodium 137 135 - 145 mmol/L   Potassium 4.4 3.5 - 5.1 mmol/L   Chloride 108 101 - 111 mmol/L   CO2 22 22 - 32 mmol/L   Glucose, Bld 86 65 - 99 mg/dL   BUN 19 6 - 20 mg/dL   Creatinine, Ser 1.36 (H) 0.61 - 1.24 mg/dL   Calcium 7.8 (L) 8.9 - 10.3 mg/dL   Total Protein 5.1 (L) 6.5 - 8.1 g/dL   Albumin 2.0 (L) 3.5 - 5.0 g/dL   AST 19 15 - 41 U/L   ALT 13 (L) 17 - 63 U/L   Alkaline Phosphatase 65 38 - 126 U/L   Total Bilirubin 1.3 (H) 0.3 - 1.2 mg/dL   GFR calc non Af Amer 51 (L) >60 mL/min   GFR calc Af Amer 59 (L) >60 mL/min    Comment: (NOTE) The eGFR has  been calculated using the CKD EPI equation. This calculation has not been validated in all clinical situations. eGFR's persistently <60 mL/min signify possible Chronic Kidney Disease.    Anion gap 7 5 - 15  CBC     Status: Abnormal   Collection Time: 07/27/16  5:48 AM  Result Value Ref Range   WBC 9.9 4.0 - 10.5 K/uL   RBC 3.06 (L) 4.22 - 5.81 MIL/uL   Hemoglobin 9.3 (L) 13.0 - 17.0 g/dL   HCT 28.7 (L) 39.0 - 52.0 %   MCV 93.8 78.0 - 100.0 fL   MCH 30.4 26.0 - 34.0 pg   MCHC 32.4 30.0 - 36.0 g/dL   RDW 19.3 (H) 11.5 - 15.5 %   Platelets 123 (L) 150 - 400 K/uL  CBC     Status: Abnormal   Collection Time: 07/28/16  6:44 AM  Result Value Ref Range   WBC 11.3 (H) 4.0 - 10.5 K/uL   RBC 2.99 (L) 4.22 - 5.81 MIL/uL   Hemoglobin 8.9 (L) 13.0 - 17.0 g/dL   HCT 28.1 (L) 39.0 - 52.0 %   MCV 94.0 78.0 - 100.0 fL   MCH 29.8 26.0 - 34.0 pg   MCHC 31.7 30.0 - 36.0 g/dL   RDW 18.4 (H) 11.5 - 15.5 %   Platelets 139 (L) 150 - 400 K/uL     Lipid Panel  No results found for: CHOL, TRIG, HDL, CHOLHDL, VLDL, LDLCALC, LDLDIRECT   No results found for: HGBA1C   Lab Results  Component Value Date   CREATININE 1.36 (H) 07/27/2016     HPI    70 y.o.malewith medical  history of RA on methotrexate and prednisone, vascular dementia, bipolar disorder, prior CVA, and HTN who presents after falling this morning at the skilled nursing facility with complaints of left hip pain.Imaging studies revealed a left subcapital femur fracture.    HOSPITAL COURSE:   Recurrent Fall with left subcapital femur fracture: Recently discharged on 07/12/16 to SNF Acute. Risk factors include chronic steroid use. Telemetry uneventful, EKG within normal limits - Hip fracture order set initiated - pain control prn - Dr. Lyla Glassing consulted in the ED, f/u with orthopedics  I do not see any obvious contraindications to surgery, although this would be a moderate risk procedure given other comorbidities such as  prior history of CVA, age, hypertension  Status post Lefthip hemiarthroplasty, anterior approach 9/27 Lovenox for 30 days for DVT prophylaxis    anemia of chronic disease-baseline around 12.5, hemoglobin around 8.9,  Mild thrombocytopenia-monitor on Lovenox Follow CBC  closely   Leukocytosis WBC 16.9 on admission. The differential includes infection(patient just recently treated for C. difficile infection during last hospitalization) versus chronic steroids versus reactive.UA, chest x-ray negative  Leukocytosis resolved   Chronic kidney disease stage III: Creatinine near baseline of 1.3 , but patient noted to have elevation in BUN to 28 suggesting possible signs of dehydration. Improved   History of dementia and bipolar disorder - Continuing home medications of Aricept and Seroquel (reduced to 25 mg QHS, QTC 500> msec). Check magnesium. Repeat EKG shows QTC of  450. Patient does not have capacity to make medical decisions. As per RN report, patient is ward of the state. No agitation.     COPD without acute exacerbation  - continue Breo - Duonebs prn SOB/Wheezing  Essential hypertension  - continue metoprolol , blood pressure soft Suspect mild adrenal insufficiency, therefore the patient received 2 dose of IV Solu-Cortef and then resume prednisone   Elevated lipase: Mild elevation to 52. No significant abdominal pain noted on physical exam     Tobacco abuse -continue nicotine patch  Rheumatoid arthritis Holding methotrexate. Continue prednisone. Patient's medications need to be verified with his PCP and if on methotrexate prior to admission, may be resumed in a week's time.  BPH - continue finasteride  Right leg wound - Continue topical wound care as per wound care recommendations made today.  C. difficile diarrhea - Continue oral vancomycin. probiotics.  - Diarrhea resolved.    Blood pressure 108/65, pulse 60, temperature 98.2 F (36.8 C),  temperature source Oral, resp. rate 16, height '5\' 11"'  (1.803 m), weight 59.9 kg (132 lb 0.9 oz), SpO2 98 %.  General exam: Appears calm and comfortable  Respiratory system: Clear to auscultation. Respiratory effort normal. Cardiovascular system: S1 & S2 heard, RRR. No JVD, murmurs, rubs, gallops or clicks. No pedal edema. Gastrointestinal system: Abdomen is nondistended, soft and nontender. No organomegaly or masses felt. Normal bowel sounds heard. Central nervous system confused at baseline No focal neurological deficits. Extremities: Symmetric 5 x 5 power. Skin: No rashes, lesions or ulcers Psychiatry: Judgement and insight appear normal. Mood & affect appropriate.     Follow-up Information    Swinteck, Horald Pollen, MD. Schedule an appointment as soon as possible for a visit in 2 week(s).   Specialty:  Orthopedic Surgery Why:  For wound re-check Contact information: [address] [place] [address] [phone]        Myrtis Hopping, MD. Schedule an appointment as soon as possible for a visit in 2 day(s).   Specialty:  Internal Medicine Why:  hospital follow up Contact information: [address] [place] [address] [phone]           Signed: Reyne Dumas 07/28/2016, 10:34 AM        Time spent >45 mins

## 2016-07-28 NOTE — NC FL2 (Signed)
[place] MEDICAID FL2 LEVEL OF CARE SCREENING TOOL     IDENTIFICATION  Patient Name: Paul Hansen Birthdate: 03/30/1946 Sex: male Admission Date (Current Location): 07/25/2016  [facility] and [place] Number:  Producer, television/film/video and Address:  [facility],  [address], [place] [address]      Provider Number: [id number]  Attending Physician Name and Address:  Richarda Overlie, MD  Relative Name and Phone Number:       Current Level of Care: Hospital Recommended Level of Care: Skilled Nursing Facility Prior Approval Number:    Date Approved/Denied:   PASRR Number:    Discharge Plan: SNF    Current Diagnoses: Patient Active Problem List   Diagnosis Date Noted  . Fracture   . Left displaced femoral neck fracture (HCC) 07/26/2016  . Subcapital fracture of femur (HCC) 07/25/2016  . Diarrhea 07/06/2016  . Fall 07/06/2016  . Dementia 07/06/2016  . HTN (hypertension) 07/06/2016  . Bipolar disorder (HCC) 07/06/2016  . History of CVA (cerebrovascular accident) 07/06/2016  . RA (rheumatoid arthritis) (HCC) 07/06/2016  . AKI (acute kidney injury) (HCC) 07/06/2016  . Dehydration 07/06/2016  . Anemia 07/06/2016  . Hypokalemia 07/06/2016  . Decubitus ulcer of sacral region, stage 1 07/06/2016  . Tinea 07/06/2016    Orientation RESPIRATION BLADDER Height & Weight     Self  Normal Incontinent Weight: 132 lb 0.9 oz (59.9 kg) Height:  5\' 11"  (180.3 cm)  BEHAVIORAL SYMPTOMS/MOOD NEUROLOGICAL BOWEL NUTRITION STATUS   (none)  (None) Continent    AMBULATORY STATUS COMMUNICATION OF NEEDS Skin   Extensive Assist Verbally Surgical wounds (Incision Closed: LT Hip, RT forehead. Wounds for fall RT leg outer and LT leg  outer)                       Personal Care Assistance Level of Assistance  Bathing, Feeding, Dressing Bathing Assistance: Limited assistance Feeding assistance: Independent Dressing Assistance: Limited assistance     Functional Limitations Info   Sight, Hearing, Speech Sight Info: Adequate Hearing Info: Adequate Speech Info: Adequate    SPECIAL CARE FACTORS FREQUENCY  PT (By licensed PT), OT (By licensed OT)     PT Frequency: 5/ week OT Frequency: 5/ week            Contractures Contractures Info: Not present    Additional Factors Info  Code Status, Allergies Code Status Info: Full  Allergies Info: NKDA           Current Medications (07/28/2016):  This is the current hospital active medication list Current Facility-Administered Medications  Medication Dose Route Frequency Provider Last Rate Last Dose  . 0.9 %  sodium chloride infusion   Intravenous Continuous 07/30/2016, MD 75 mL/hr at 07/26/16 1233    . acetaminophen (TYLENOL) tablet 650 mg  650 mg Oral Q6H PRN 07/28/16, MD       Or  . acetaminophen (TYLENOL) suppository 650 mg  650 mg Rectal Q6H PRN Samson Frederic, MD      . donepezil (ARICEPT) tablet 10 mg  10 mg Oral BH-q7a Samson Frederic, MD   10 mg at 07/28/16 0814  . enoxaparin (LOVENOX) injection 40 mg  40 mg Subcutaneous Q24H 07/30/16, MD   40 mg at 07/28/16 0814  . finasteride (PROSCAR) tablet 5 mg  5 mg Oral Daily 07/30/16, MD   5 mg at 07/28/16 0813  . fluticasone furoate-vilanterol (BREO ELLIPTA) 100-25 MCG/INH 1 puff  1 puff Inhalation Daily Clydie Braun, MD   1 puff at 07/28/16 1010  . folic acid (FOLVITE) tablet 1 mg  1 mg Oral Daily Clydie Braun, MD   1 mg at 07/28/16 0814  . HYDROcodone-acetaminophen (NORCO/VICODIN) 5-325 MG per tablet 1-2 tablet  1-2 tablet Oral Q6H PRN Samson Frederic, MD   2 tablet at 07/28/16 0358  . HYDROmorphone (DILAUDID) injection 0.25-0.5 mg  0.25-0.5 mg Intravenous Q5 min PRN Kipp Brood, MD      . ipratropium-albuterol (DUONEB) 0.5-2.5 (3) MG/3ML nebulizer solution 3 mL  3 mL Nebulization Q2H PRN Rondell Burtis Junes, MD      . menthol-cetylpyridinium (CEPACOL) lozenge 3 mg  1 lozenge Oral PRN Samson Frederic, MD       Or  . phenol  (CHLORASEPTIC) mouth spray 1 spray  1 spray Mouth/Throat PRN Samson Frederic, MD      . methocarbamol (ROBAXIN) tablet 500 mg  500 mg Oral Q6H PRN Samson Frederic, MD   500 mg at 07/28/16 4332   Or  . methocarbamol (ROBAXIN) 500 mg in dextrose 5 % 50 mL IVPB  500 mg Intravenous Q6H PRN Samson Frederic, MD      . metoCLOPramide (REGLAN) tablet 5-10 mg  5-10 mg Oral Q8H PRN Samson Frederic, MD       Or  . metoCLOPramide (REGLAN) injection 5-10 mg  5-10 mg Intravenous Q8H PRN Samson Frederic, MD   10 mg at 07/27/16 0029  . metoprolol tartrate (LOPRESSOR) tablet 25 mg  25 mg Oral BID Clydie Braun, MD   25 mg at 07/28/16 0813  . morphine 2 MG/ML injection 0.5 mg  0.5 mg Intravenous Q2H PRN Samson Frederic, MD   0.5 mg at 07/27/16 0053  . nicotine (NICODERM CQ - dosed in mg/24 hours) patch 14 mg  14 mg Transdermal Daily Clydie Braun, MD   14 mg at 07/28/16 0814  . ondansetron (ZOFRAN) injection 4 mg  4 mg Intravenous Once PRN Kipp Brood, MD      . ondansetron [facility]) tablet 4 mg  4 mg Oral Q6H PRN Samson Frederic, MD       Or  . ondansetron [facility]) injection 4 mg  4 mg Intravenous Q6H PRN Samson Frederic, MD   4 mg at 07/26/16 2329  . oxyCODONE (Oxy IR/ROXICODONE) immediate release tablet 5 mg  5 mg Oral Once PRN Kipp Brood, MD       Or  . oxyCODONE (ROXICODONE) 5 MG/5ML solution 5 mg  5 mg Oral Once PRN Kipp Brood, MD      . polyethylene glycol (MIRALAX / GLYCOLAX) packet 17 g  17 g Oral Daily PRN Samson Frederic, MD      . predniSONE (DELTASONE) tablet 5 mg  5 mg Oral Q breakfast Richarda Overlie, MD   5 mg at 07/28/16 0813  . saccharomyces boulardii (FLORASTOR) capsule 250 mg  250 mg Oral BID Clydie Braun, MD   250 mg at 07/28/16 0813  . senna-docusate (Senokot-S) tablet 1 tablet  1 tablet Oral QHS Clydie Braun, MD   1 tablet at 07/27/16 2112     Discharge Medications: Please see discharge summary for a list of discharge medications.  Relevant Imaging Results:  Relevant Lab  Results:   Additional Information SS#: [id number]  Reggy Eye, LCSW

## 2016-07-28 NOTE — Clinical Social Work Note (Signed)
Per MD patient is ready to discharge to [facility]. RN, patient, patient's legal guardian [place], and facility notified of discharge. RN given phone number for report and transport packet is on patient's chart. Ambulance transport requested. CSW signing off.

## 2016-07-28 NOTE — Progress Notes (Signed)
   Subjective: Patient reports pain as mild to moderate.  No c/o.  Objective:   VITALS:   Vitals:   07/27/16 1424 07/27/16 1736 07/27/16 2135 07/28/16 0637  BP: 134/88 (!) 147/84 (!) 150/76 108/65  Pulse: 65  (!) 59 60  Resp: 18 16 16 16   Temp: 98.5 F (36.9 C) 98.7 F (37.1 C) 98.9 F (37.2 C) 98.2 F (36.8 C)  TempSrc: Oral Oral Oral Oral  SpO2: 100% 98% 95% 98%  Weight:      Height:        NAD ABD soft Sensation intact distally Intact pulses distally Dorsiflexion/Plantar flexion intact Incision: dressing C/D/I Compartment soft   Lab Results  Component Value Date   WBC 11.3 (H) 07/28/2016   HGB 8.9 (L) 07/28/2016   HCT 28.1 (L) 07/28/2016   MCV 94.0 07/28/2016   PLT 139 (L) 07/28/2016   BMET    Component Value Date/Time   NA 137 07/27/2016 0548   K 4.4 07/27/2016 0548   CL 108 07/27/2016 0548   CO2 22 07/27/2016 0548   GLUCOSE 86 07/27/2016 0548   BUN 19 07/27/2016 0548   CREATININE 1.36 (H) 07/27/2016 0548   CALCIUM 7.8 (L) 07/27/2016 0548   GFRNONAA 51 (L) 07/27/2016 0548   GFRAA 59 (L) 07/27/2016 0548     Assessment/Plan: 2 Days Post-Op   Principal Problem:   Subcapital fracture of femur (HCC) Active Problems:   Fall   Dementia   RA (rheumatoid arthritis) (HCC)   Decubitus ulcer of sacral region, stage 1   Left displaced femoral neck fracture (HCC)   Fracture   Difficulty in walking, not elsewhere classified    WBAT with walker DVT ppx: lovenox x30 days, SCDs, TEDs PT/OT D/C planning    Paul Hansen, 07/29/2016 07/28/2016, 10:42 AM   07/30/2016, MD Cell [phone]

## 2016-07-28 NOTE — Progress Notes (Signed)
Report called to [facility] at [facility]

## 2016-07-28 NOTE — Progress Notes (Signed)
DC at this time to [facility] by [facility].

## 2016-07-28 NOTE — Progress Notes (Signed)
OT Cancellation Note  Patient Details Name: Paul Hansen MRN: [id number] DOB: 06/28/1946   Cancelled Treatment:    Reason Eval/Treat Not Completed: Fatigue/lethargy limiting ability to participate;Other (comment). Pt politely declined OT today stating that he was too tired after working with PT earlier and had just got back to bed. Pt will also d/c back to SNF today  Galen Manila 07/28/2016, 12:30 PM

## 2016-08-07 ENCOUNTER — Encounter (HOSPITAL_COMMUNITY): Payer: Self-pay | Admitting: Orthopedic Surgery

## 2016-08-07 NOTE — Addendum Note (Signed)
Addendum  created 08/07/16 1416 by Adair Laundry, CRNA   Anesthesia Event edited
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
## 2017-11-05 IMAGING — CT CT HEAD W/O CM
2 of 8 series · 9 of 47 positions shown, 11 images · non-contrast
Comparison: Brain MRI 12/03/2009.

CLINICAL DATA: Fall last night. Laceration above the right eye.
Initial encounter.

EXAM:
CT HEAD WITHOUT CONTRAST
CT CERVICAL SPINE WITHOUT CONTRAST
TECHNIQUE: Multidetector CT imaging of the head and cervical spine was
performed following the standard protocol without intravenous
contrast. Multiplanar CT image reconstructions of the cervical spine
were also generated.

[Series 306: cor · coronal · 0.33mm/px · 1 of 53 slices shown]
[im 48/53  brain]
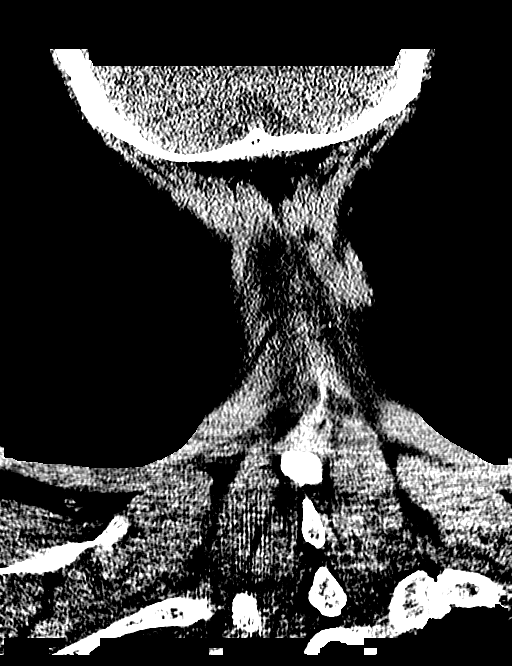

[Series 307: orthog · axial · 0.33mm/px · z∈[+70,+215]mm · 8 of 96 slices shown, 10 images]
[im 11/96  brain]
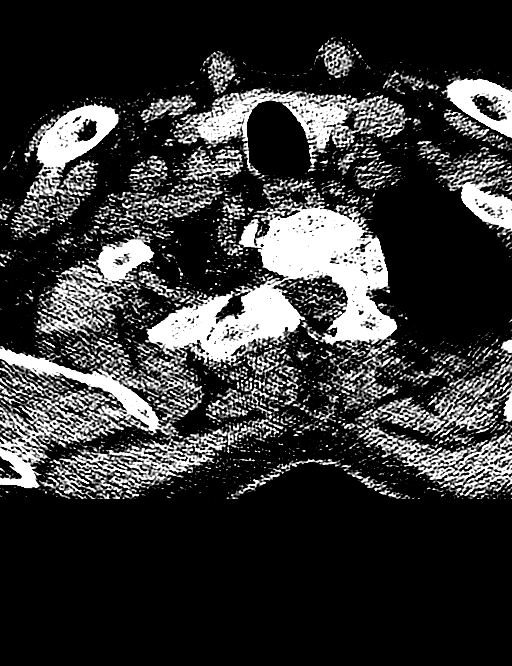
[im 11/96  bone]
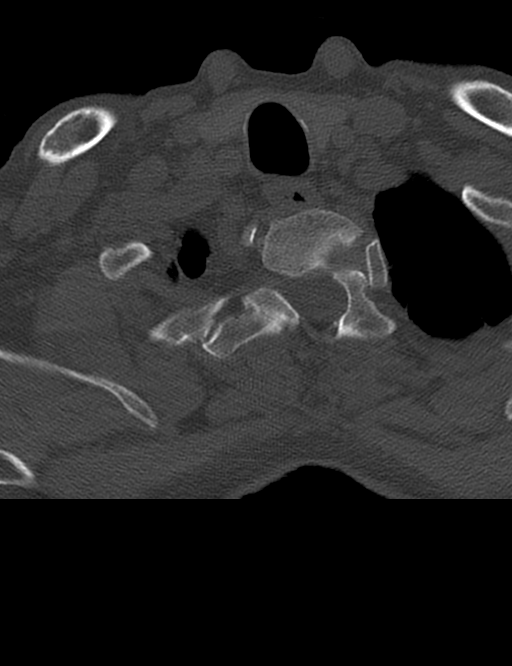
[im 22/96  brain]
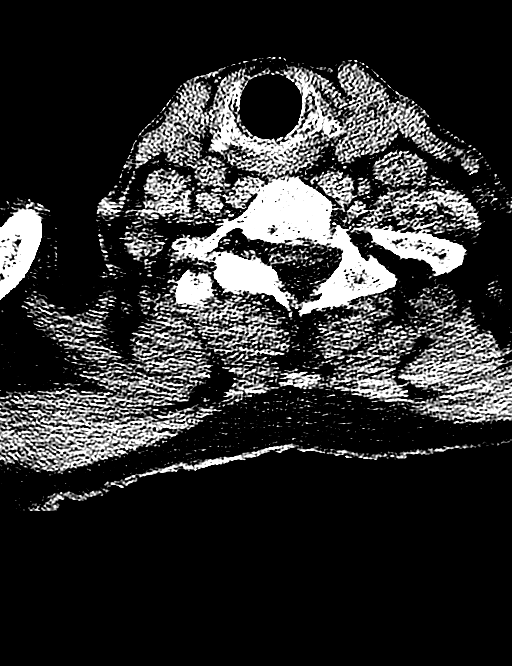
[im 32/96  brain]
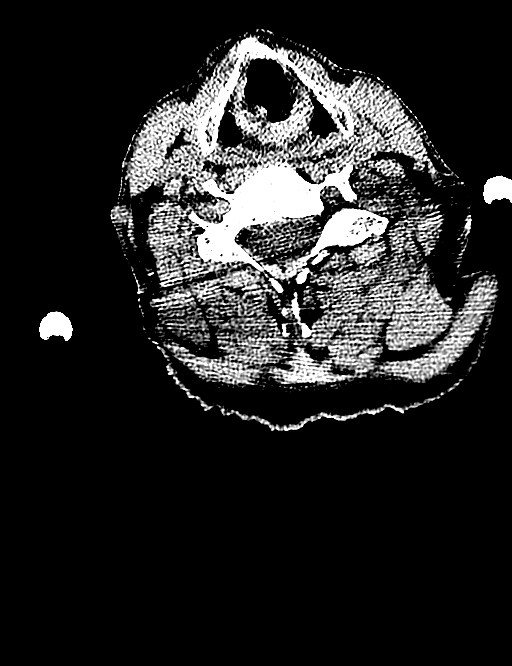
[im 43/96  brain]
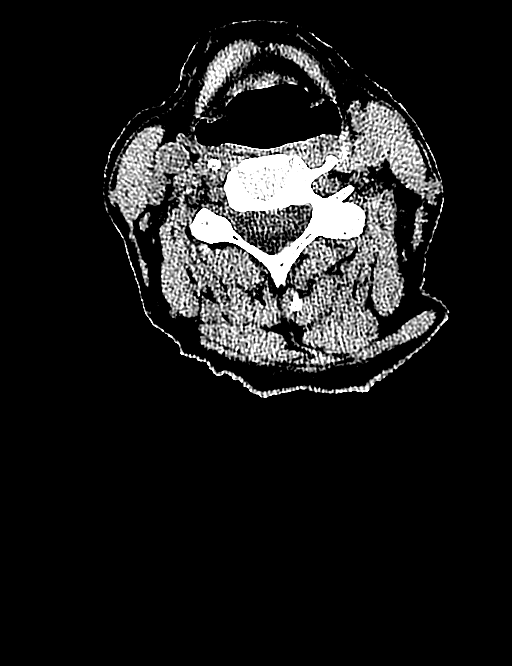
[im 53/96  brain]
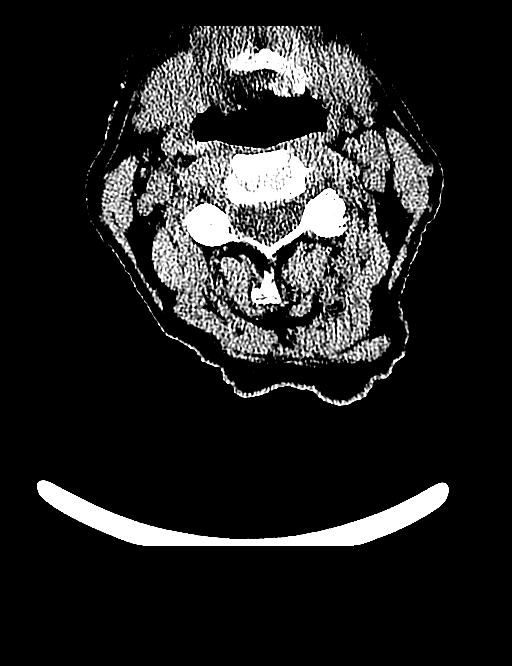
[im 53/96  bone]
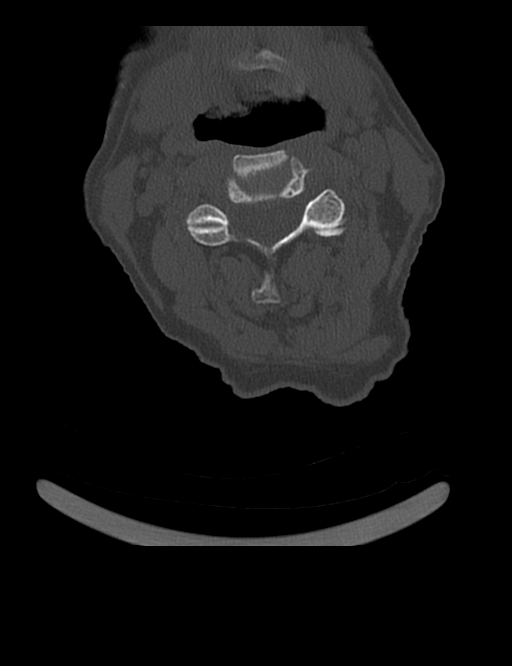
[im 64/96  brain]
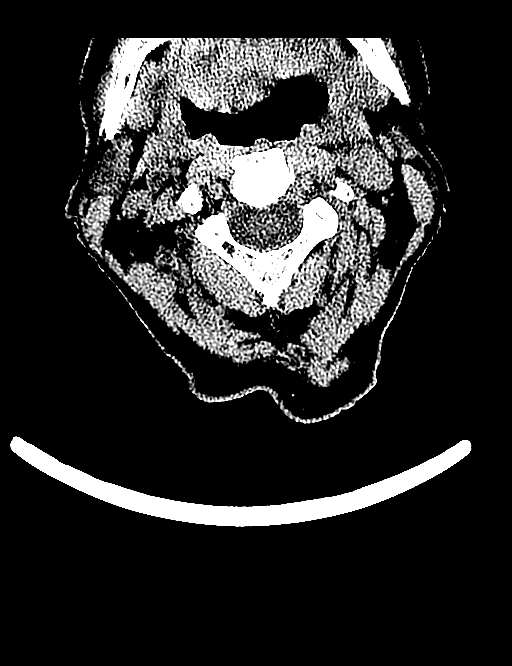
[im 74/96  brain]
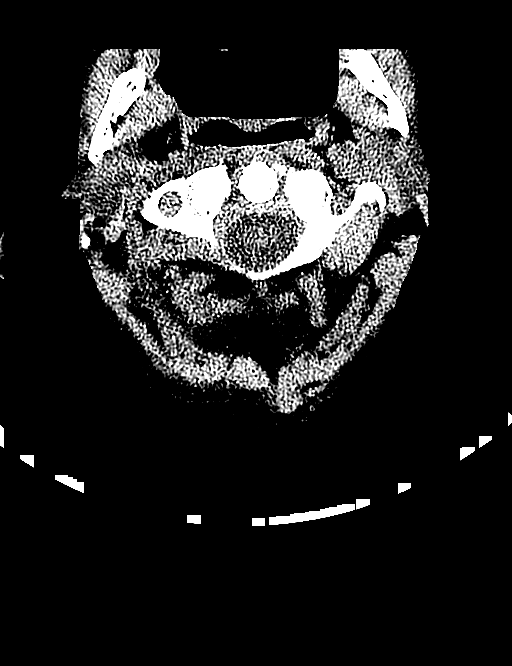
[im 85/96  brain]
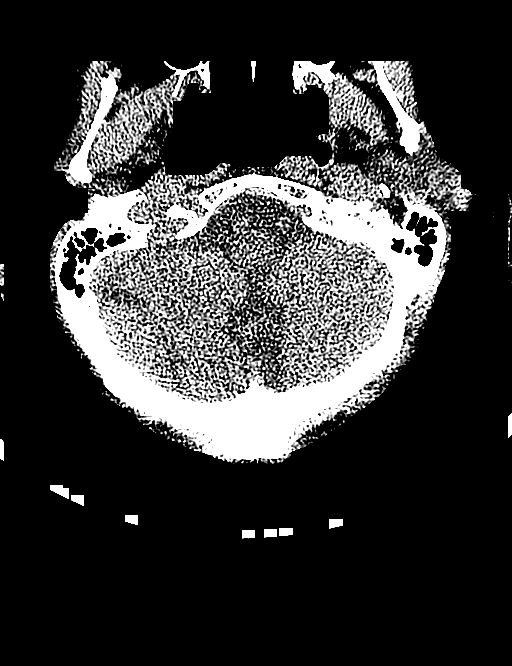

[9 of 47 positions shown; findings below may reference images not displayed]

FINDINGS: CT HEAD FINDINGS

Brain: There is moderate cerebral atrophy with greatest involvement
of the parietal lobes. Chronic infarcts are present in the
cerebellum and left basal ganglia. There is no evidence of acute
cortical infarct, intracranial hemorrhage, mass, midline shift, or
extra-axial fluid collection. Patchy to confluent cerebral white
matter hypodensities are nonspecific but compatible with moderate
chronic small vessel ischemic disease, advanced for age.

Vascular: Calcified atherosclerosis at the skullbase.

Skull: No fracture identified.

Sinuses/Orbits: Visualized paranasal sinuses and mastoid air cells
are clear. Orbits are unremarkable.

Other: Mild supraorbital soft tissue swelling.

CT CERVICAL SPINE FINDINGS

Alignment: No evidence of traumatic subluxation.

Skull base and vertebrae: No fracture or destructive osseous lesion.

Soft tissues and spinal canal: No prevertebral edema. Mild
right-sided cervical atherosclerosis.

Disc levels:  Mild cervical spondylosis, most notable at C6-7.

Upper chest: Advanced centrilobular emphysema. Right greater than
left apical lung scarring.

Other: None.
IMPRESSION: 1. No evidence of acute intracranial abnormality.
2. Mild right supraorbital soft tissue swelling.
3. Moderately advanced chronic small vessel ischemic disease.
4. No acute abnormality identified in the cervical spine.

## 2017-11-05 IMAGING — DX DG FOREARM 2V*R*
2 series · 2 of 2 positions shown · non-contrast
Comparison: None.

CLINICAL DATA: Fell today.  Right forearm pain.

EXAM:
RIGHT FOREARM - 2 VIEW

[x forearm ap right]
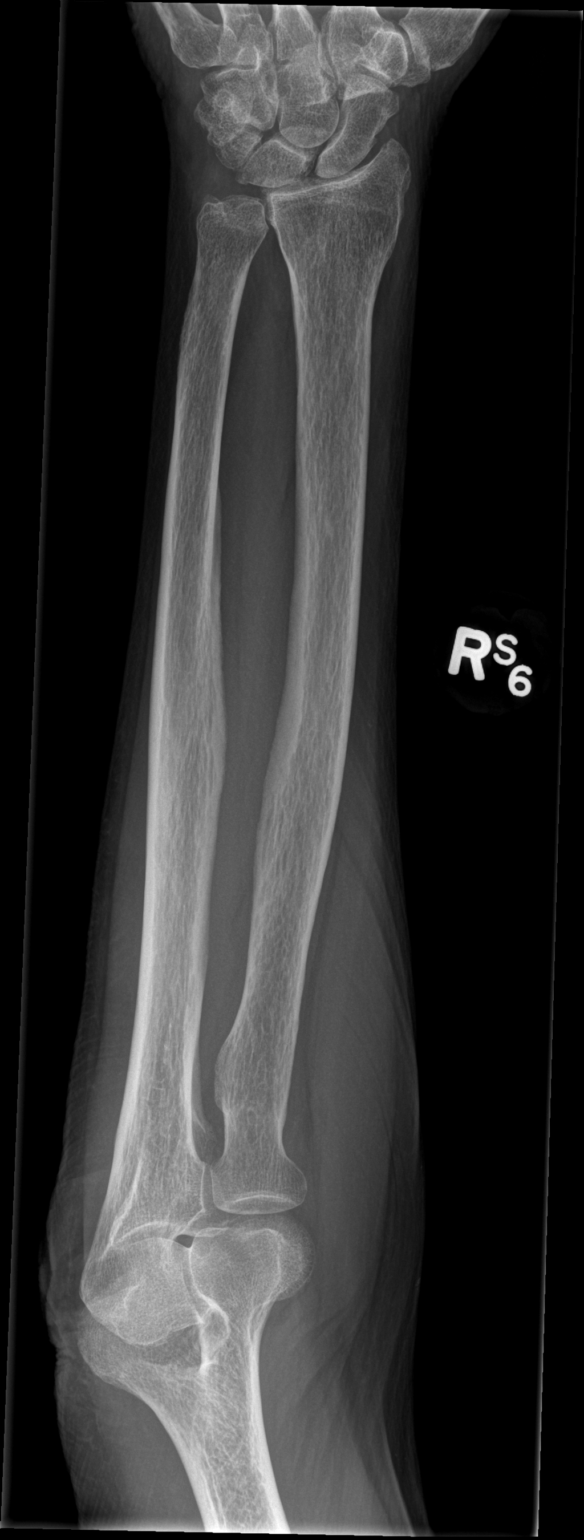

[x forearm lat right]
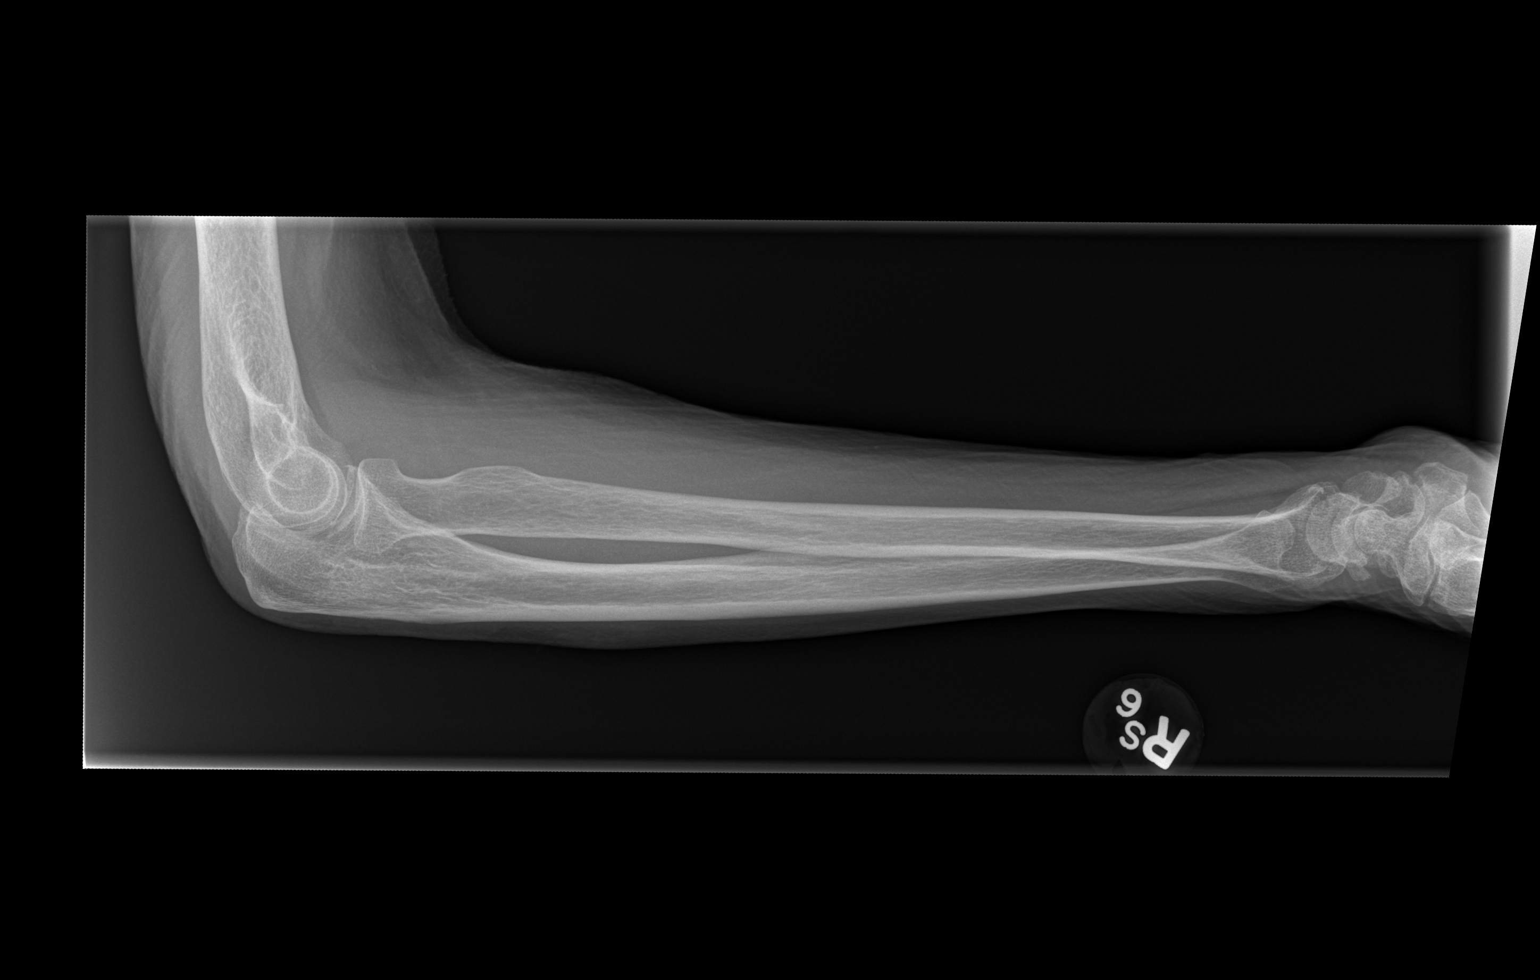

[2 of 2 positions shown; findings below may reference images not displayed]

FINDINGS: The wrist and elbow joints are maintained. No acute forearm
fracture.
IMPRESSION: No acute fracture.

## 2019-12-17 ENCOUNTER — Non-Acute Institutional Stay: Payer: Medicare Other | Admitting: Internal Medicine

## 2019-12-17 ENCOUNTER — Other Ambulatory Visit: Payer: Self-pay

## 2019-12-17 DIAGNOSIS — Z515 Encounter for palliative care: Secondary | ICD-10-CM

## 2019-12-17 DIAGNOSIS — F01518 Vascular dementia, unspecified severity, with other behavioral disturbance: Secondary | ICD-10-CM

## 2019-12-17 DIAGNOSIS — F0151 Vascular dementia with behavioral disturbance: Secondary | ICD-10-CM

## 2020-01-08 NOTE — Progress Notes (Signed)
[facility] Consult Note Telephone: [phone]  Fax: [phone]  PATIENT NAME: Paul Hansen DOB: 26-Dec-1945 MRN: [id number]  PRIMARY CARE PROVIDER:   Dr. Caprice Renshaw  REFERRING PROVIDER: Dr. Caprice Renshaw  RESPONSIBLE PARTY:   Blenda Mounts (guardian) [phone]    RECOMMENDATIONS and PLAN:  Palliative Care Encounter Z51.5  1.  Advance care planning:  Explanation of palliative care visit with patient but due to level of cognitive deficits, plan on communication with guardian for full discussion, review of goals and advanced directives.  Palliative care will continue to follow patient.  2.  Vascular dementia with behaviors:  FAST stage 7b.  Mobile via wheelchair.  Continue long-term psychiatry evaluations and treatment for optimum management of behaviors.  Seroquel dose could be increased from 50mg  to 75mg  during day hours if consistently agitated.   3.  Lesion of back:  Recommend Dermatology or surgery evaluation of tender lesion of upper back.  It has the appearance of a skin cancer. The discomfort of this lesion is also a source of his agitation.  4. Underweight with weight loss:  Related to debilitated state and comorbidities.  Offer nutritional supplements 2-3 times per day.  Consider appetite stimulant to improve nutritional intake.  Weekly weights.  I spent 35 minutes providing this consultation,  from 1610 to 1645. More than 50% of the time in this consultation was spent coordinating communication with patient and facility staff. Medical records reviewed.  HISTORY OF PRESENT ILLNESS:  Paul Hansen is a 74 y.o. year old male with multiple medical problems including COPD, COVID-19, bipolar disorder, vascular dementia with behaviors and CVA.  Staff reports that he has episodes of agitation.  He has a fair appetite, requires assistance with ADLs, is mobile via wheelchair.  He is able to feed self.  Palliative Care was asked to help  address goals of care.   CODE STATUS: FULL CODE  PPS: 40% HOSPICE ELIGIBILITY/DIAGNOSIS: TBD  PAST MEDICAL HISTORY:  Past Medical History:  Diagnosis Date  . Bipolar disorder ([place])   . Hypertension   . Rheumatoid arthritis ([place])   . Stroke ([place])   . Vascular dementia    ALLERGIES: No Known Allergies    PERTINENT MEDICATIONS:  Outpatient Encounter Medications as of 12/17/2019  Medication Sig  . collagenase (SANTYL) ointment Apply topically daily. Apply Santyl to right outer leg wound Q day, then cover with moist fluffed gauze and foam dressing.  (Change foam dressing Q 5 days or PRN soiling.) (Patient not taking: Reported on 07/25/2016)  . donepezil (ARICEPT) 10 MG tablet Take 10 mg by mouth every morning.   . feeding supplement (BOOST / RESOURCE BREEZE) LIQD Take 1 Container by mouth 3 (three) times daily between meals. (Patient not taking: Reported on 07/25/2016)  . finasteride (PROSCAR) 5 MG tablet Take 5 mg by mouth daily.  . fluticasone furoate-vilanterol (BREO ELLIPTA) 100-25 MCG/INH AEPB Inhale 1 puff into the lungs daily.  . folic acid (FOLVITE) 1 MG tablet Take 1 mg by mouth daily.  . methocarbamol (ROBAXIN) 500 MG tablet Take 1 tablet (500 mg total) by mouth every 6 (six) hours as needed for muscle spasms.  . metoprolol (LOPRESSOR) 50 MG tablet Take 25 mg by mouth 2 (two) times daily.   . nicotine (NICODERM CQ - DOSED IN MG/24 HOURS) 14 mg/24hr patch Place 1 patch (14 mg total) onto the skin daily.  Marland Kitchen nystatin (MYCOSTATIN/NYSTOP) powder Apply topically 2 (two) times daily. Apply to inguinal  folds (Patient not taking: Reported on 07/25/2016)  . polyethylene glycol (MIRALAX / GLYCOLAX) packet Take 17 g by mouth daily as needed for mild constipation.  . predniSONE (DELTASONE) 5 MG tablet Take 5 mg by mouth daily.  . QUEtiapine (SEROQUEL) 50 MG tablet Take 1 tablet (50 mg total) by mouth at bedtime.  . saccharomyces boulardii (FLORASTOR) 250 MG capsule Take 1 capsule (250 mg total)  by mouth 2 (two) times daily.  Marland Kitchen senna-docusate (SENOKOT-S) 8.6-50 MG tablet Take 1 tablet by mouth at bedtime.  . traMADol (ULTRAM) 50 MG tablet Take 1 tablet (50 mg total) by mouth every 6 (six) hours as needed.  . vancomycin (VANCOCIN) 50 mg/mL oral solution Take 2.5 mLs (125 mg total) by mouth 4 (four) times daily. Discontinue after 07/20/16 doses. (Patient not taking: Reported on 07/25/2016)   No facility-administered encounter medications on file as of 12/17/2019.    PHYSICAL EXAM:   General: NAD, frail appearing, thin elderly male reclining in bed Cardiovascular: regular rate and rhythm Pulmonary: scattered rhochi of upper fields Abdomen: soft, flat nontender, + bowel sounds Extremities: poor muscle tone, no edema Skin: very tender raised lesion of upper right back, Aprox. 2cm round Neurological: Somnolent but aroused easily.  Oriented to self. Weakness  Psych:  Cooperative until lesion of back was gently palpated in which he immediately became agitated  Margaretha Sheffield, NP-C

## 2020-01-19 ENCOUNTER — Non-Acute Institutional Stay: Payer: Medicare Other | Admitting: Internal Medicine

## 2020-03-03 NOTE — Progress Notes (Signed)
Therapist, nutritional Palliative Care Consult Note Telephone: [phone]  Fax: [phone]  PATIENT NAME: Paul Hansen DOB: 1946/01/26 MRN: [id number]  PRIMARY CARE PROVIDER:   Dr. Charlynne Pander  REFERRING PROVIDER: Dr. Charlynne Pander  RESPONSIBLE PARTY:   Annell Greening (guardian) [phone]    RECOMMENDATIONS and PLAN:  Palliative Care Encounter Z51.5  1.  Advance care planning:  Phone conversation with guardian, [facility], who states that advanced directives will remain as full code with full scope of treatment. MOST form completed and placed in chart.  MOST selections are (attempt CPR, iv fluids and antibiotic if indicated and feeding tube).  [facility] NP updated on pt status as well and  plans to attempt to contact family member to attempt DNAR status.  Plan on follow-up with patient.  2.  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  Mobile via wheelchair.  Continue long-term psychiatry evaluations and treatment for optimum management of behaviors.  Seroquel dose could be increased to 75mg . .   3.. Lesion of back:  Recommend Dermatology or surgery evaluation of tender lesion of upper back.  It has the appearance of a skin cancer. The discomfort of this lesion is also a source of his agitation. Discussed recommendation with Guardian who reports that she does not feel that patient would be compliant with appointment. Encouraged additional evaluation of abnormal lesion.     4. Underweight with weight loss:  Unchanged.  Offer nutritional supplements 2-3 times per day.  Consider appetite stimulant to improve nutritional intake.  Weekly weights.  Poor trajectory of no improvement of nutritional status.   I spent 35 minutes providing this consultation,  from 1610 to 1645. More than 50% of the time in this consultation was spent coordinating communication with patient and facility staff. Medical records reviewed.  HISTORY OF PRESENT ILLNESS:  Follow up with  . Staff reports that he continues to have intermittent episodes of agitation.  He has a fair appetite, requires assistance with ADLs, is mobile via wheelchair.  He is able to feed self.  Palliative Care was asked to help address goals of care.   CODE STATUS: FULL CODE  PPS: 40% mobile via wheelchair HOSPICE ELIGIBILITY/DIAGNOSIS: TBD  PAST MEDICAL HISTORY:  Past Medical History:  Diagnosis Date  . Bipolar disorder (HCC)   . Hypertension   . Rheumatoid arthritis (HCC)   . Stroke (HCC)   . Vascular dementia    ALLERGIES: No Known Allergies    PERTINENT MEDICATIONS:  Outpatient Encounter Medications as of 12/17/2019  Medication Sig  . collagenase (SANTYL) ointment Apply topically daily. Apply Santyl to right outer leg wound Q day, then cover with moist fluffed gauze and foam dressing.  (Change foam dressing Q 5 days or PRN soiling.) (Patient not taking: Reported on 07/25/2016)  . donepezil (ARICEPT) 10 MG tablet Take 10 mg by mouth every morning.   . feeding supplement (BOOST / RESOURCE BREEZE) LIQD Take 1 Container by mouth 3 (three) times daily between meals. (Patient not taking: Reported on 07/25/2016)  . finasteride (PROSCAR) 5 MG tablet Take 5 mg by mouth daily.  . fluticasone furoate-vilanterol (BREO ELLIPTA) 100-25 MCG/INH AEPB Inhale 1 puff into the lungs daily.  . folic acid (FOLVITE) 1 MG tablet Take 1 mg by mouth daily.  . methocarbamol (ROBAXIN) 500 MG tablet Take 1 tablet (500 mg total) by mouth every 6 (six) hours as needed for muscle spasms.  . metoprolol (LOPRESSOR) 50 MG tablet  Take 25 mg by mouth 2 (two) times daily.   . nicotine (NICODERM CQ - DOSED IN MG/24 HOURS) 14 mg/24hr patch Place 1 patch (14 mg total) onto the skin daily.  Marland Kitchen nystatin (MYCOSTATIN/NYSTOP) powder Apply topically 2 (two) times daily. Apply to inguinal folds (Patient not taking: Reported on 07/25/2016)  . polyethylene glycol (MIRALAX / GLYCOLAX) packet Take 17 g by mouth daily as needed for  mild constipation.  . predniSONE (DELTASONE) 5 MG tablet Take 5 mg by mouth daily.  . QUEtiapine (SEROQUEL) 50 MG tablet Take 1 tablet (50 mg total) by mouth at bedtime.  . saccharomyces boulardii (FLORASTOR) 250 MG capsule Take 1 capsule (250 mg total) by mouth 2 (two) times daily.  Marland Kitchen senna-docusate (SENOKOT-S) 8.6-50 MG tablet Take 1 tablet by mouth at bedtime.  . traMADol (ULTRAM) 50 MG tablet Take 1 tablet (50 mg total) by mouth every 6 (six) hours as needed.  . vancomycin (VANCOCIN) 50 mg/mL oral solution Take 2.5 mLs (125 mg total) by mouth 4 (four) times daily. Discontinue after 07/20/16 doses. (Patient not taking: Reported on 07/25/2016)   No facility-administered encounter medications on file as of 12/17/2019.    PHYSICAL EXAM:   General: NAD, frail appearing, thin elderly male reclining in bed Cardiovascular: regular rate and rhythm Pulmonary: clear throughout Abdomen: soft, flat nontender, + bowel sounds Extremities: poor muscle tone, no edema Skin: Raised lesion of upper right back, Aprox. 2cm round with centralized necrosis Neurological: Somnolent but aroused easily.  Oriented to self. Weakness.  Speaks in brief sentences Psych:  Fannin, NP-C
